# Patient Record
Sex: Female | Born: 1950 | Race: White | Hispanic: No | Marital: Single | Smoking: Former smoker
Health system: Southern US, Community
[De-identification: ages and names within clinical notes are randomized; demographics above are authoritative.]

## PROBLEM LIST (undated history)

## (undated) DIAGNOSIS — M069 Rheumatoid arthritis, unspecified: Secondary | ICD-10-CM

## (undated) DIAGNOSIS — E039 Hypothyroidism, unspecified: Secondary | ICD-10-CM

## (undated) DIAGNOSIS — Z860100 Personal history of colon polyps, unspecified: Secondary | ICD-10-CM

## (undated) DIAGNOSIS — I10 Essential (primary) hypertension: Secondary | ICD-10-CM

## (undated) DIAGNOSIS — K602 Anal fissure, unspecified: Secondary | ICD-10-CM

## (undated) DIAGNOSIS — E7800 Pure hypercholesterolemia, unspecified: Secondary | ICD-10-CM

## (undated) DIAGNOSIS — K649 Unspecified hemorrhoids: Secondary | ICD-10-CM

## (undated) HISTORY — DX: Essential (primary) hypertension: I10

## (undated) HISTORY — DX: Rheumatoid arthritis, unspecified: M06.9

## (undated) HISTORY — DX: Anal fissure, unspecified: K60.2

## (undated) HISTORY — DX: Pure hypercholesterolemia, unspecified: E78.00

## (undated) HISTORY — PX: TONSILLECTOMY: SUR1361

## (undated) HISTORY — DX: Personal history of colon polyps, unspecified: Z86.0100

## (undated) HISTORY — DX: Unspecified hemorrhoids: K64.9

## (undated) HISTORY — DX: Hypothyroidism, unspecified: E03.9

## (undated) HISTORY — DX: Personal history of colonic polyps: Z86.010

---

## 1997-06-19 HISTORY — PX: HEMORRHOID SURGERY: SHX153

## 1997-12-22 ENCOUNTER — Other Ambulatory Visit: Admission: RE | Admit: 1997-12-22 | Discharge: 1997-12-22 | Payer: Self-pay | Admitting: Obstetrics and Gynecology

## 1997-12-22 ENCOUNTER — Ambulatory Visit (HOSPITAL_COMMUNITY): Admission: RE | Admit: 1997-12-22 | Discharge: 1997-12-22 | Payer: Self-pay | Admitting: Obstetrics and Gynecology

## 1998-03-31 ENCOUNTER — Other Ambulatory Visit: Admission: RE | Admit: 1998-03-31 | Discharge: 1998-03-31 | Payer: Self-pay | Admitting: Obstetrics and Gynecology

## 1999-05-04 ENCOUNTER — Encounter: Payer: Self-pay | Admitting: *Deleted

## 1999-05-04 ENCOUNTER — Ambulatory Visit (HOSPITAL_COMMUNITY): Admission: RE | Admit: 1999-05-04 | Discharge: 1999-05-04 | Payer: Self-pay | Admitting: *Deleted

## 2010-05-05 ENCOUNTER — Encounter: Admission: RE | Admit: 2010-05-05 | Discharge: 2010-05-05 | Payer: Self-pay | Admitting: Obstetrics and Gynecology

## 2011-05-03 ENCOUNTER — Other Ambulatory Visit: Payer: Self-pay | Admitting: Obstetrics and Gynecology

## 2011-05-03 DIAGNOSIS — R928 Other abnormal and inconclusive findings on diagnostic imaging of breast: Secondary | ICD-10-CM

## 2011-05-18 ENCOUNTER — Other Ambulatory Visit: Payer: Self-pay | Admitting: Obstetrics and Gynecology

## 2011-05-18 ENCOUNTER — Ambulatory Visit
Admission: RE | Admit: 2011-05-18 | Discharge: 2011-05-18 | Disposition: A | Discharge status: Home / Self Care | Payer: No Typology Code available for payment source | Source: Ambulatory Visit | Attending: Obstetrics and Gynecology | Admitting: Obstetrics and Gynecology

## 2011-05-18 DIAGNOSIS — R928 Other abnormal and inconclusive findings on diagnostic imaging of breast: Secondary | ICD-10-CM

## 2011-06-20 HISTORY — PX: BREAST REDUCTION SURGERY: SHX8

## 2011-06-22 ENCOUNTER — Ambulatory Visit
Admission: RE | Admit: 2011-06-22 | Discharge: 2011-06-22 | Disposition: A | Discharge status: Home / Self Care | Payer: PRIVATE HEALTH INSURANCE | Source: Ambulatory Visit | Attending: Obstetrics and Gynecology | Admitting: Obstetrics and Gynecology

## 2011-06-22 DIAGNOSIS — R928 Other abnormal and inconclusive findings on diagnostic imaging of breast: Secondary | ICD-10-CM

## 2012-03-07 ENCOUNTER — Other Ambulatory Visit: Payer: Self-pay | Admitting: Plastic Surgery

## 2013-10-03 ENCOUNTER — Other Ambulatory Visit: Payer: Self-pay | Admitting: Obstetrics and Gynecology

## 2013-10-03 DIAGNOSIS — Z9889 Other specified postprocedural states: Secondary | ICD-10-CM

## 2013-10-03 DIAGNOSIS — N6310 Unspecified lump in the right breast, unspecified quadrant: Secondary | ICD-10-CM

## 2013-10-10 ENCOUNTER — Ambulatory Visit
Admission: RE | Admit: 2013-10-10 | Discharge: 2013-10-10 | Disposition: A | Discharge status: Home / Self Care | Payer: PRIVATE HEALTH INSURANCE | Source: Ambulatory Visit | Attending: Obstetrics and Gynecology | Admitting: Obstetrics and Gynecology

## 2013-10-10 ENCOUNTER — Encounter (INDEPENDENT_AMBULATORY_CARE_PROVIDER_SITE_OTHER): Payer: Self-pay

## 2013-10-10 DIAGNOSIS — N6310 Unspecified lump in the right breast, unspecified quadrant: Secondary | ICD-10-CM

## 2013-10-10 DIAGNOSIS — Z9889 Other specified postprocedural states: Secondary | ICD-10-CM

## 2014-05-08 HISTORY — PX: COLONOSCOPY: SHX174

## 2014-05-08 HISTORY — PX: ESOPHAGOGASTRODUODENOSCOPY: SHX1529

## 2014-11-01 IMAGING — MG MM DIGITAL DIAGNOSTIC BILAT
5 series · 5 of 5 positions shown · non-contrast
Comparison: Diagnostic left mammogram views dated 05/05/2010.

CLINICAL DATA: Palpable area of concern in the retroareolar right
breast in the 8 to 9 o'clock region. History of breast reduction in
3323. The patient denies any signs symptoms of chest infection.

EXAM:
DIGITAL DIAGNOSTIC  BILATERAL MAMMOGRAM WITH CAD
ULTRASOUND RIGHT BREAST

[R TAN]
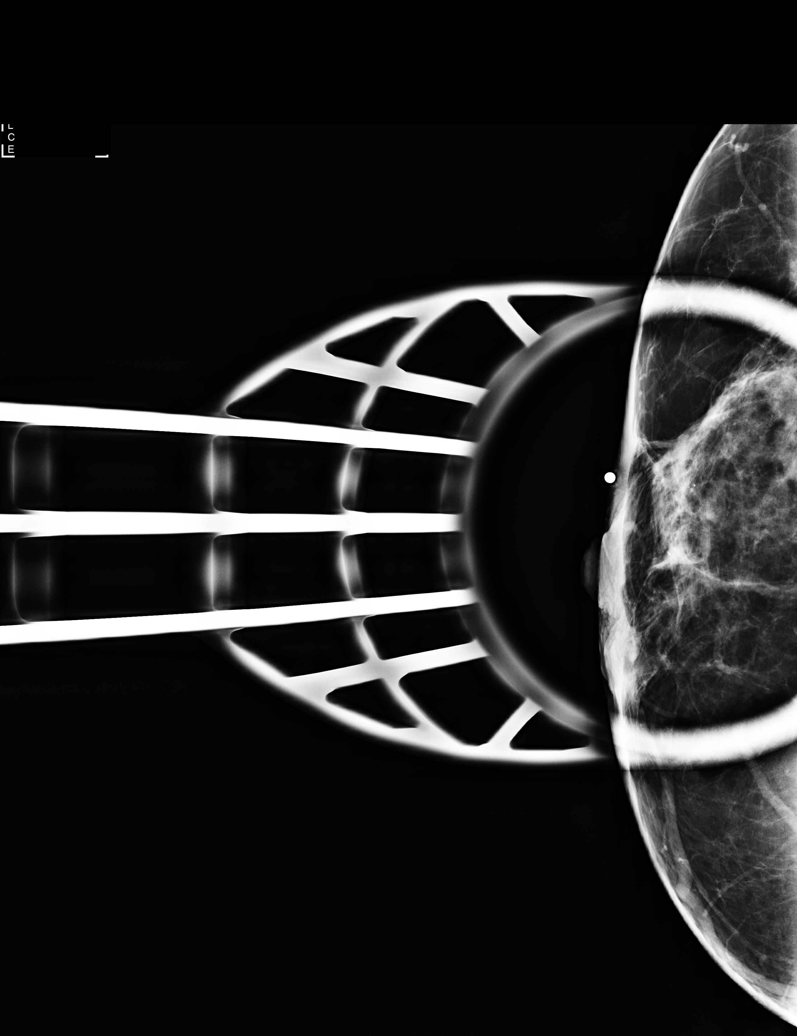

[R CC]
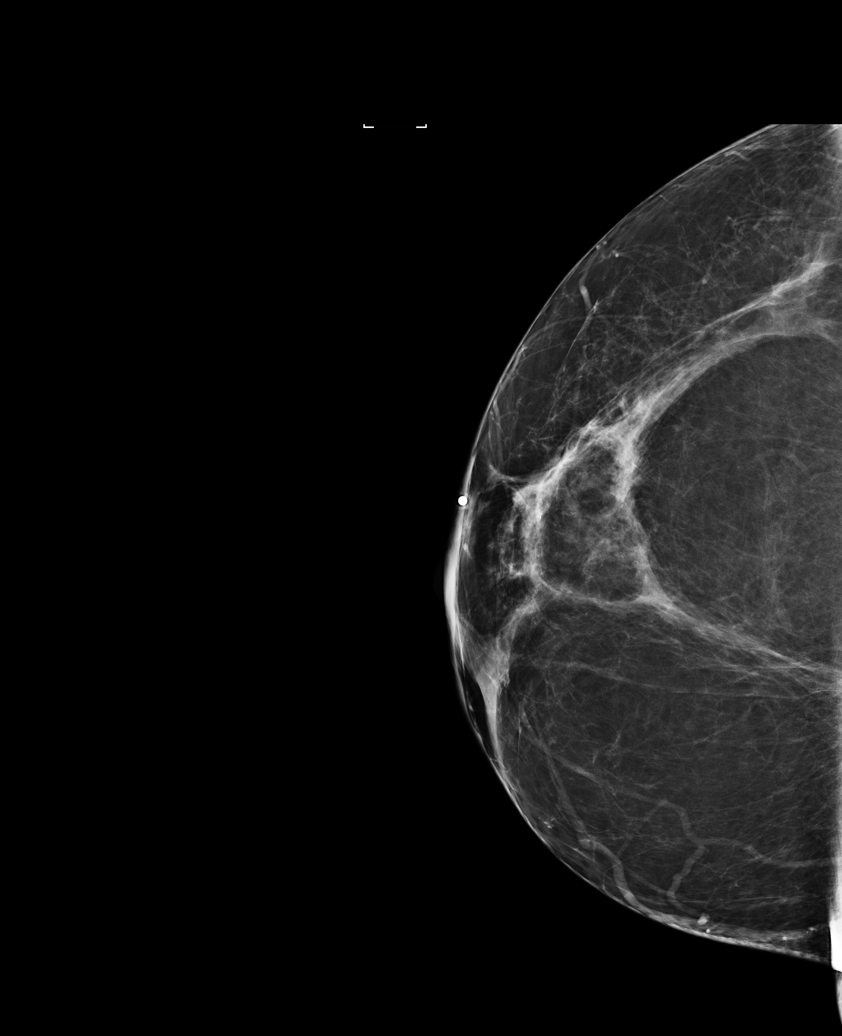

[L MLO]
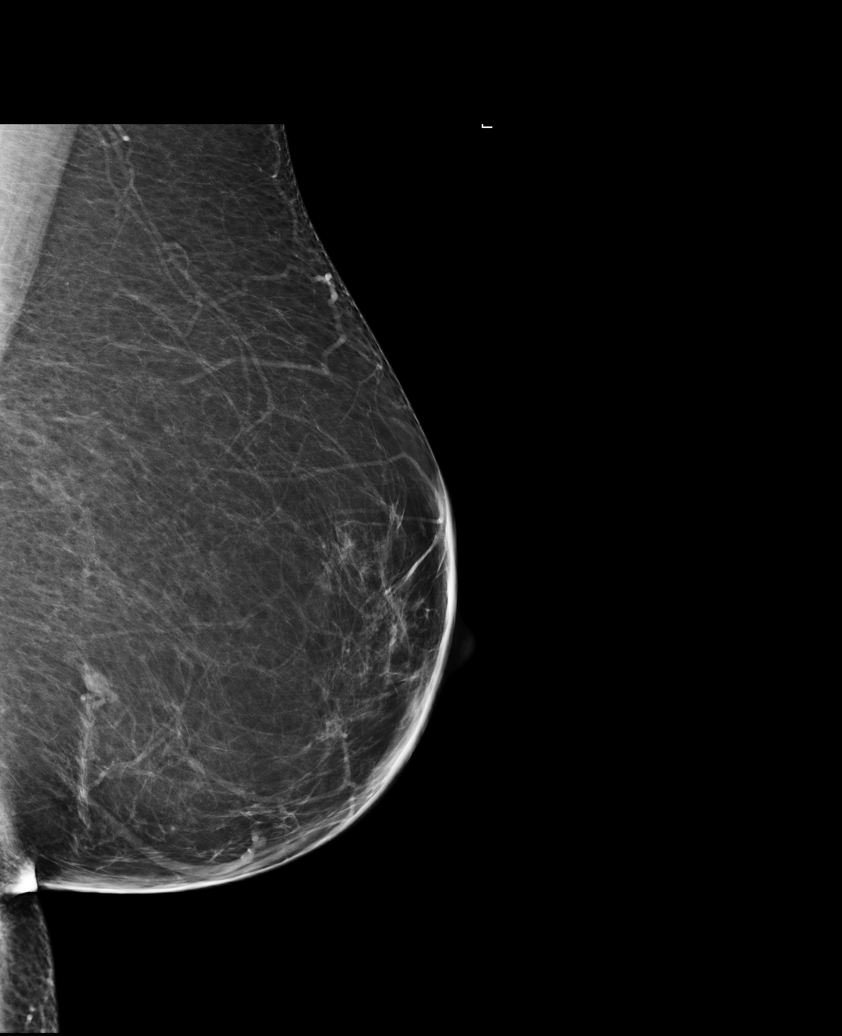

[R MLO]
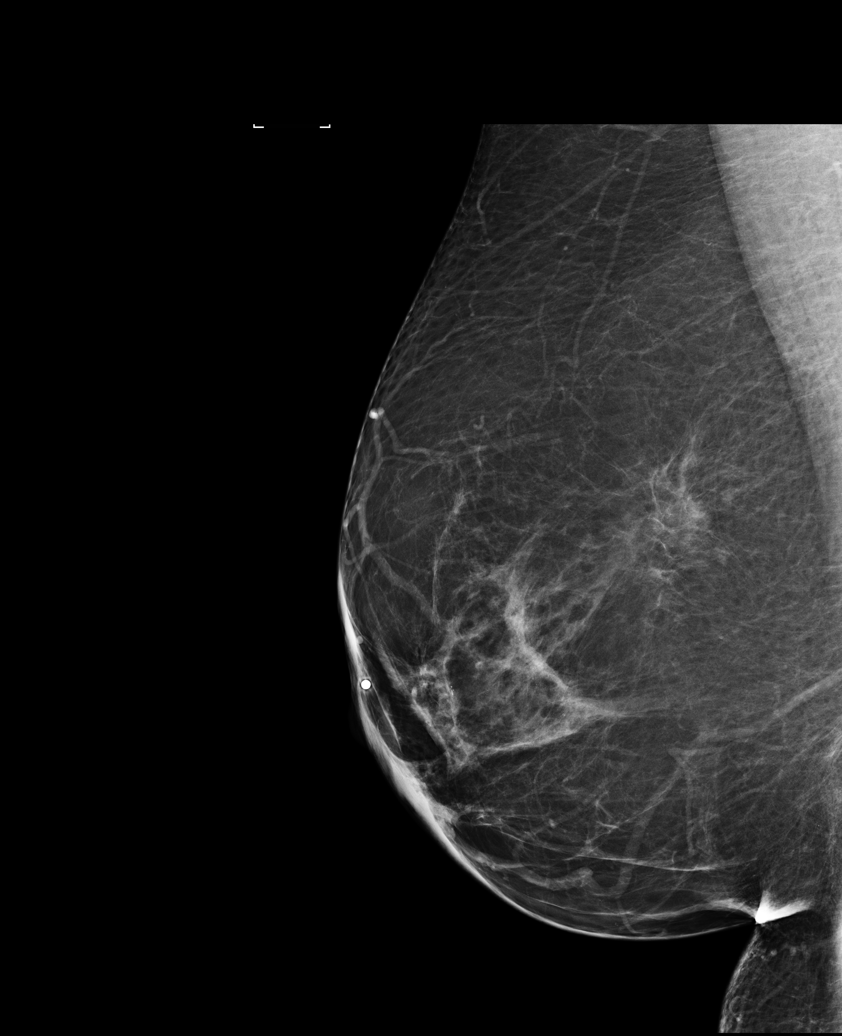

[L CC]
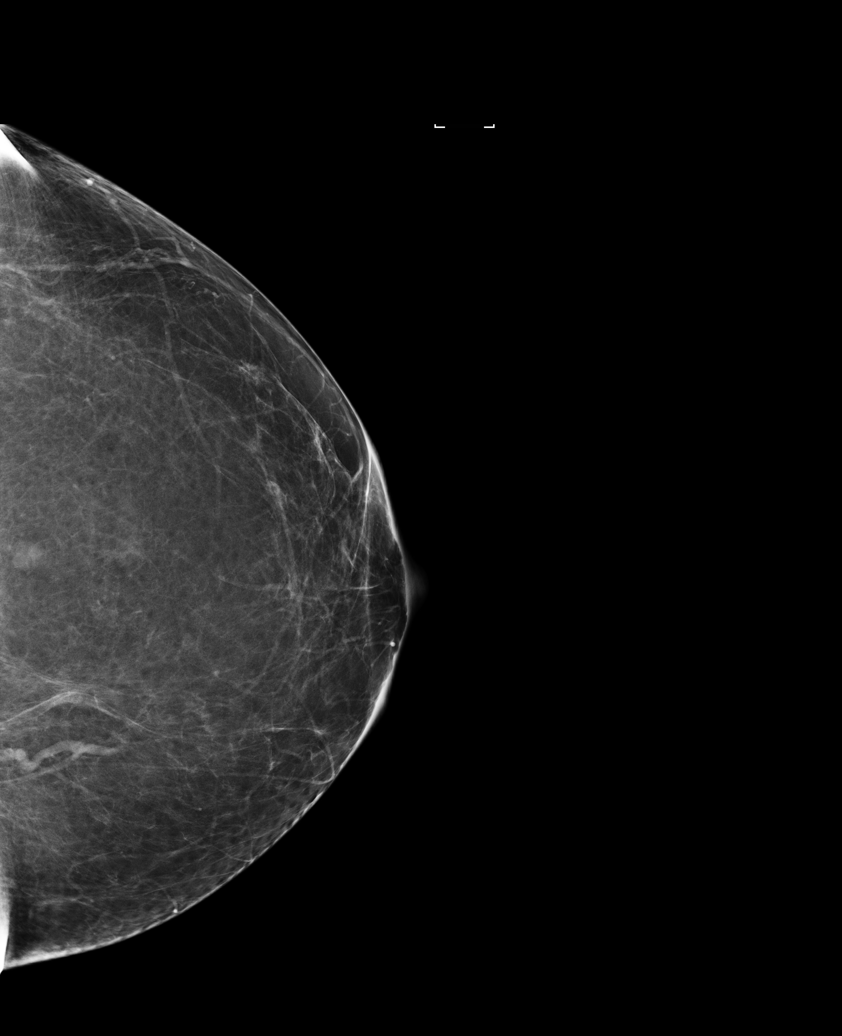

[5 of 5 positions shown; findings below may reference images not displayed]

ACR Breast Density Category b: There are scattered areas of
fibroglandular density.
FINDINGS: There are postsurgical changes of breast reduction bilaterally.
Postsurgical changes are more prominent in the right breast.
Curvilinear bandlike density surround a central area of fat density
in the retroareolar right breast at the region of palpable concern.
Mammographic findings are most consistent with postsurgical fat
necrosis. No suspicious, nonsurgical distortion, or suspicious
microcalcification is identified in either breast.

Mammographic images were processed with CAD.

On physical exam, there is a firm palpable area in the retroareolar
right breast centered at the 8 to 9 o'clock position.

Ultrasound is performed, showing a focal fluid collection at 9
o'clock position retroareolar that measures 3.0 x 1.6 x 1.9 cm. This
corresponds to a prominent area of fat density seen on the
mammogram, and is consistent with benign postsurgical fat necrosis,
in this patient status post bilateral breast reduction. No
suspicious findings on ultrasound.
IMPRESSION: Reduction mammoplasty changes bilaterally. No evidence of
malignancy. The palpable area of concern of the right breast is
consistent with benign fat necrosis.

RECOMMENDATION:
Screening mammogram in one year.(Code:VR-X-N8A)

I have discussed the findings and recommendations with the patient.
Results were also provided in writing at the conclusion of the
visit. If applicable, a reminder letter will be sent to the patient
regarding the next appointment.

BI-RADS CATEGORY  2: Benign.

## 2015-01-12 ENCOUNTER — Other Ambulatory Visit: Payer: Self-pay | Admitting: Obstetrics and Gynecology

## 2015-01-13 LAB — CYTOLOGY - PAP

## 2015-04-15 ENCOUNTER — Ambulatory Visit (INDEPENDENT_AMBULATORY_CARE_PROVIDER_SITE_OTHER): Payer: PRIVATE HEALTH INSURANCE | Admitting: Sports Medicine

## 2015-04-15 ENCOUNTER — Ambulatory Visit (INDEPENDENT_AMBULATORY_CARE_PROVIDER_SITE_OTHER): Payer: PRIVATE HEALTH INSURANCE

## 2015-04-15 DIAGNOSIS — M7752 Other enthesopathy of left foot: Secondary | ICD-10-CM | POA: Diagnosis not present

## 2015-04-15 DIAGNOSIS — I739 Peripheral vascular disease, unspecified: Secondary | ICD-10-CM | POA: Diagnosis not present

## 2015-04-15 DIAGNOSIS — M722 Plantar fascial fibromatosis: Secondary | ICD-10-CM

## 2015-04-15 DIAGNOSIS — M778 Other enthesopathies, not elsewhere classified: Secondary | ICD-10-CM

## 2015-04-15 DIAGNOSIS — M79673 Pain in unspecified foot: Secondary | ICD-10-CM | POA: Diagnosis not present

## 2015-04-15 DIAGNOSIS — M7742 Metatarsalgia, left foot: Secondary | ICD-10-CM | POA: Diagnosis not present

## 2015-04-15 DIAGNOSIS — M779 Enthesopathy, unspecified: Secondary | ICD-10-CM

## 2015-04-15 NOTE — Progress Notes (Deleted)
   Subjective:    Patient ID: Brandi Potter, female    DOB: 06-16-1951, 64 y.o.   MRN: [id number]  HPI    Review of Systems  Endocrine:       Thyroid  Musculoskeletal:       Right ankle swelling , " some "   All other systems reviewed and are negative.      Objective:   Physical Exam        Assessment & Plan:

## 2015-04-15 NOTE — Progress Notes (Signed)
Patient ID: Brandi Potter, female   DOB: Dec 04, 1950, 64 y.o.   MRN: [id number] Subjective: Brandi Potter is a 64 y.o. female patient presents to office with complaint of heel pain on the right heel. Patient admits to post static dyskinesia since the summer; reports that she bought new sandals that are hard on the back of her heels that started her symptoms. The area appeared to get better but still is bothering her and so she thought she should get it checked out. Patient has treated this problem with wear sneakers with some improvement. Patient reports that also her left 2nd toe hurts; has past injury to foot 10 years ago in [place] and never had it checked. Patient states that the 2nd toe hurts sometimes with walking and is made better by the shoes that she wears. Denies any other pedal complaints.  Review of Systems  Endocrine:       Thyroid  Musculoskeletal:       Right ankle swelling , " some "   All other systems reviewed and are negative.   There are no active problems to display for this patient.  No current outpatient prescriptions on file prior to visit.   No current facility-administered medications on file prior to visit.   Allergies  Allergen Reactions  . Aleve [Naproxen Sodium] Hives     Objective: Physical Exam General: The patient is alert and oriented x3 in no acute distress.  Dermatology: Skin is warm, dry and supple bilateral lower extremities. Nails 1-10 are normal. There is no erythema, edema, no eccymosis, no open lesions present. Integument is otherwise unremarkable.  Vascular: Dorsalis Pedis pulse and Posterior Tibial pulse are 1/4 bilateral. Capillary fill time is immediate to all digits. Mild trace edema Right>left ankles with varicosities and mild brawny hyperpigmentation.   Neurological: Grossly intact to light touch with an achilles reflex of +2/5 and a  negative Tinel's sign bilateral.  Musculoskeletal: Mild tenderness to palpation at the medial calcaneal  tubercale and through the insertion of the plantar fascia on the right foot. Mild tenderness to left 2nd MTPJ with clicking on range of motion. All other joints within normal limits. No pain with compression of calcaneus bilateral. No pain with tuning fork to calcaneus bilateral. No pain with calf compression bilateral. Strength 5/5 in all groups bilateral.   Gait: Unassisted, Non-Antalgic  Xray, Right & Left foot: 3 Views Decreased osseous mineralization. Joint spaces narrowing at 1st and 2nd mtpj with medial dislocation of the 2nd on the left. There is bunion deformity present left>right with arthritic changes. No fracture/dislocation/boney destruction. Calcaneal spur present with mild thickening of plantar fascia. No other soft tissue abnormalities or radiopaque foreign bodies.   Assessment and Plan: Problem List Items Addressed This Visit    None    Visit Diagnoses    Foot pain, unspecified laterality    -  Primary    Relevant Orders    DG Foot Complete Left    DG Foot Complete Right    Plantar fasciitis of right foot        Metatarsalgia of left foot        Capsulitis of foot, left        2nd MTPJ    PVD (peripheral vascular disease) (HCC)        Relevant Medications    atorvastatin (LIPITOR) 10 MG tablet        -Complete examination performed. Discussed with patient in detail the condition of plantar fasciitis, how this  occurs and general treatment options. Explained both conservative and surgical treatments. Also discussed in detail Left 2nd toe deviation and arthritic changes and treatment options.  -Patient refused steroid injection and anti-inflammatories at today's visit; states that she's not in that much pain to need an injection right now and wants to continue with OTC motrin as needed. -Recommended good supportive shoes. -Dispensed metatarsal pad for left; if works well can consider orthotics with met pad incorporated. - Explained in detail the use of the fascial brace  and heel cups which was dispensed at today's visit for right. -Explained and dispensed to patient daily stretching exercises. -Recommend patient to ice affected area 1-2x daily. -Patient to return to office as needed sooner if problems or questions  Arise.  Landis Martins, DPM

## 2015-04-15 NOTE — Patient Instructions (Signed)

## 2019-08-14 HISTORY — PX: HERNIA REPAIR: SHX51

## 2019-12-18 ENCOUNTER — Encounter: Payer: Self-pay | Admitting: Gastroenterology

## 2020-02-17 ENCOUNTER — Encounter: Payer: Self-pay | Admitting: Gastroenterology

## 2020-02-17 ENCOUNTER — Ambulatory Visit: Payer: PRIVATE HEALTH INSURANCE | Admitting: Gastroenterology

## 2020-02-17 VITALS — BP 124/70 | HR 62 | Ht 68.0 in | Wt 215.2 lb

## 2020-02-17 DIAGNOSIS — R152 Fecal urgency: Secondary | ICD-10-CM | POA: Diagnosis not present

## 2020-02-17 DIAGNOSIS — R159 Full incontinence of feces: Secondary | ICD-10-CM | POA: Diagnosis not present

## 2020-02-17 DIAGNOSIS — K581 Irritable bowel syndrome with constipation: Secondary | ICD-10-CM | POA: Diagnosis not present

## 2020-02-17 MED ORDER — CHOLESTYRAMINE 4 G PO PACK: 4.0000 g | PACK | Freq: Every day | ORAL | 3 refills | Status: DC

## 2020-02-17 NOTE — Patient Instructions (Signed)
If you are age 69 or older, your body mass index should be between 23-30. Your Body mass index is 32.73 kg/m. If this is out of the aforementioned range listed, please consider follow up with your Primary Care Provider.  If you are age 32 or younger, your body mass index should be between 19-25. Your Body mass index is 32.73 kg/m. If this is out of the aformentioned range listed, please consider follow up with your Primary Care Provider.   Cut down on fiber.   We have sent the following medications to your pharmacy for you to pick up at your convenience:  Cholestyramine   Please purchase the following medications over the counter and take as directed: Oscal 500 mg once daily.   Stop citracal   Follow up in 8 weeks.   Thank you,  Dr. Lynann Bologna

## 2020-02-17 NOTE — Progress Notes (Signed)
Chief Complaint:   Referring Provider:  Pc, [facility]*      ASSESSMENT AND PLAN;   #1. Rectal incontinence. Neg colon 04/2014 as below.  #2. IBS-D  Plan: -Cut down on fiber. -Trial of Cholestryamine 4g po qd. 2 hours before or after rest of the medications. -Start Oscal 500mg  po qd -Stop Citracal for now.  If need be, would add Benefiber. -FU in 8 weeks.  At FU, Kegel's exercises/Bentyl, would consider rpt colon/anorectal manometry with biofeedback if she continues to have problems.   HPI:    Brandi Potter is a 69 y.o. female  With HTN, HLD, ?Glucose intolerance (per records sent to 73, no mention of diabetes), CKD with stool incontinence, urgency since January 2021 ever since she retired from February 2021.  She denies having any problems with urine incontinence.  Has urgency to have bowel movements, right after eating.  No blood in the stool.  Describes stool as mushy, 2-3/day, after meals, especially after fatty meals.  This has "taken over my life".  She is afraid to travel.  She took Imodium to come here.  Seen by Dr. [employer].  Has been given a trial of Citracal, which improved her symptoms minimally.  No nausea, vomiting, heartburn, regurgitation, odynophagia or dysphagia.  No significant diarrhea or constipation. No melena or hematochezia. No unintentional weight loss. No abdominal or rectal pain.  No bloating.  She was started on Celebrex in June 2021.  Her symptoms predate Celebrex.  No sodas, chocolates, chewing gums, artificial sweeteners and candy. No other OTC NSAIDs  Recently had repair of periumbilical hernia.   Past GI procedures: -Colonoscopy (Dr July 2021) 05/08/2014: Mild left colonic diverticulosis, external hemorrhoids.  Repeat in 10 years.  Colonoscopy 02/2004: Diminutive colonic polyps s/p polypectomy, small internal and external hemorrhoids. Bx-hyperplastic. -EGD 05/08/2014 (Dr 05/10/2014): neg. S/P dilatation 54 Fr -CT pelvis without contrast (not CT  abdomen) 08/08/2019 periumbilical hernia, degenerative joint disease. Past Medical History:  Diagnosis Date  . Anal fissure   . High cholesterol   . History of colon polyps   . Hypertension   . Hypothyroidism   . RA (rheumatoid arthritis) (HCC)     Past Surgical History:  Procedure Laterality Date  . COLONOSCOPY     Dr 08/10/2019  . HEMORRHOID SURGERY  1999   Dr 2000  . HERNIA REPAIR  08/14/2019   periumbillical hernia  . TONSILLECTOMY      Family History  Problem Relation Age of Onset  . Cancer Father        nasopharyngeal  . Colon cancer Neg Hx     Social History   Tobacco Use  . Smoking status: Former 08/16/2019  . Smokeless tobacco: Never Used  Vaping Use  . Vaping Use: Never used  Substance Use Topics  . Alcohol use: Yes    Comment: ocassionally  . Drug use: Not Currently    Current Outpatient Medications  Medication Sig Dispense Refill  . Calcium Citrate (CITRACAL PO) Take by mouth.    . celecoxib (CELEBREX) 200 MG capsule Take 200 mg by mouth daily.    Games developer levothyroxine (SYNTHROID) 88 MCG tablet Take 88 mcg by mouth daily before breakfast.    . lisinopril (ZESTRIL) 10 MG tablet Take 5 mg by mouth daily.    . progesterone (PROMETRIUM) 200 MG capsule Take 200 mg by mouth daily.    . sertraline (ZOLOFT) 25 MG tablet Take 12.5 mg by mouth daily.      No current  facility-administered medications for this visit.    Allergies  Allergen Reactions  . Aleve [Naproxen Sodium] Hives    Review of Systems:  Constitutional: Denies fever, chills, diaphoresis, appetite change and fatigue.  HEENT: Denies photophobia, eye pain, redness, hearing loss, ear pain, congestion, sore throat, rhinorrhea, sneezing, mouth sores, neck pain, neck stiffness and tinnitus.   Respiratory: Denies SOB, DOE, cough, chest tightness,  and wheezing.   Cardiovascular: Denies chest pain, palpitations and leg swelling.  Genitourinary: Denies dysuria, urgency, frequency, hematuria, flank pain  and difficulty urinating.  Musculoskeletal: Denies myalgias, has back pain, joint swelling, arthralgias and gait problem.  Skin: No rash.  Neurological: Denies dizziness, seizures, syncope, weakness, light-headedness, numbness and headaches.  Hematological: Denies adenopathy. Easy bruising, personal or family bleeding history  Psychiatric/Behavioral: Has anxiety or depression     Physical Exam:    BP 124/70   Pulse 62   Ht 5\' 8"  (1.727 m)   Wt 215 lb 4 oz (97.6 kg)   BMI 32.73 kg/m  Wt Readings from Last 3 Encounters:  02/17/20 215 lb 4 oz (97.6 kg)   Constitutional:  Well-developed, in no acute distress. Psychiatric: Normal mood and affect. Behavior is normal. HEENT: Pupils normal.  Conjunctivae are normal. No scleral icterus. Neck supple.  Cardiovascular: Normal rate, regular rhythm. No edema Pulmonary/chest: Effort normal and breath sounds normal. No wheezing, rales or rhonchi. Abdominal: Soft, nondistended. Nontender.  Well-healed surgical scars.  Bowel sounds active throughout. There are no masses palpable. No hepatomegaly. Rectal: Seen in presence of Brook CMA.  Small external hemorrhoids, good rectal tone.  Stool brown heme-negative. Neurological: Alert and oriented to person place and time. Skin: Skin is warm and dry. No rashes noted. Extensive notes were reviewed   02/19/20, MD 02/17/2020, 11:26 AM  Cc: P[facility], [facility]*

## 2020-03-12 ENCOUNTER — Other Ambulatory Visit: Payer: Self-pay | Admitting: Internal Medicine

## 2020-03-12 DIAGNOSIS — N1831 Chronic kidney disease, stage 3a: Secondary | ICD-10-CM

## 2020-03-15 ENCOUNTER — Telehealth: Payer: Self-pay | Admitting: Gastroenterology

## 2020-03-15 NOTE — Telephone Encounter (Signed)
Pt is requesting a call back from a nurse to discuss Questran medication. Pt states this medication is too expensive for her and if there is any alternative cheaper medications.

## 2020-03-16 NOTE — Telephone Encounter (Signed)
Please advise 

## 2020-03-18 ENCOUNTER — Ambulatory Visit
Admission: RE | Admit: 2020-03-18 | Discharge: 2020-03-18 | Disposition: A | Discharge status: Home / Self Care | Payer: Medicare Other | Source: Ambulatory Visit | Attending: Internal Medicine | Admitting: Internal Medicine

## 2020-03-18 DIAGNOSIS — N1831 Chronic kidney disease, stage 3a: Secondary | ICD-10-CM

## 2020-03-22 MED ORDER — CHOLESTYRAMINE 4 G PO PACK: 4.0000 g | PACK | Freq: Every day | ORAL | 0 refills | Status: DC

## 2020-03-22 NOTE — Telephone Encounter (Signed)
Trish, Please make sure that she is taking generic cholestyramine (not cholestyramine lite) The other medicines may be more expensive Can try cutting it down to half a pack a day (and see if it works as good-will cut cost to half [employer]

## 2020-03-22 NOTE — Telephone Encounter (Signed)
I have called and spoke to patient, patient is agreeable with the plan. I have mailed patient a GoodRx card to try to reduce the price. Patient was made an appointment per her request.

## 2020-05-17 ENCOUNTER — Encounter: Payer: Self-pay | Admitting: Gastroenterology

## 2020-05-17 ENCOUNTER — Ambulatory Visit: Payer: Medicare Other | Admitting: Gastroenterology

## 2020-05-17 VITALS — BP 120/64 | HR 72 | Ht 67.5 in | Wt 216.5 lb

## 2020-05-17 DIAGNOSIS — K581 Irritable bowel syndrome with constipation: Secondary | ICD-10-CM | POA: Diagnosis not present

## 2020-05-17 DIAGNOSIS — R159 Full incontinence of feces: Secondary | ICD-10-CM | POA: Diagnosis not present

## 2020-05-17 DIAGNOSIS — R152 Fecal urgency: Secondary | ICD-10-CM

## 2020-05-17 MED ORDER — CHOLESTYRAMINE 4 G PO PACK: 4.0000 g | PACK | Freq: Every day | ORAL | 6 refills | Status: DC

## 2020-05-17 NOTE — Progress Notes (Signed)
Chief Complaint:   Referring Provider:  Pc, [facility]*      ASSESSMENT AND PLAN;   #1. Rectal incontinence (resolved). Neg colon 04/2014 as below.  #2. IBS-D  #3. LLQ pain-likely musculoskeletal. R/O other causes. Neg CT pelvis 07/2019  Plan: - Continue FU with Dr Melanee Spry at [facility]. - If LLQ pain gets worse, then CT AP with PO/IV.  At the present time she would like to hold off. - Continue Cholestryamine 4g po qd. 2 hours before or after rest of the medications #30, 6 refills - Continue Oscal 500mg  po qd   HPI:    Brandi Potter is a 69 y.o. female  With HTN, HLD, ?Glucose intolerance (per records sent to 78, no mention of diabetes), CKD with stool incontinence, urgency since January 2021 ever since she retired from February 2021.  She denies having any problems with urine incontinence.  Much better now ever since she has been on Questran.  Would occasionally get constipated.  C/O LLQ pain x few months. Not worse after defecation.  No fever or chills.  No nausea, vomiting, heartburn, regurgitation, odynophagia or dysphagia.  No significant diarrhea or constipation. No melena or hematochezia. No unintentional weight loss. No abdominal or rectal pain.  No bloating.  She was started on Celebrex in June 2021.  Her symptoms predate Celebrex.  No sodas, chocolates, chewing gums, artificial sweeteners and candy. No other OTC NSAIDs  Recently had repair of periumbilical hernia.   Past GI procedures: -Colonoscopy (Dr July 2021) 05/08/2014: Mild left colonic diverticulosis, external hemorrhoids.  Repeat in 10 years.  Colonoscopy 02/2004: Diminutive colonic polyps s/p polypectomy, small internal and external hemorrhoids. Bx-hyperplastic. -EGD 05/08/2014 (Dr 05/10/2014): neg. S/P dilatation 54 Fr -CT pelvis without contrast (not CT abdomen) 08/08/2019 periumbilical hernia, degenerative joint disease. Past Medical History:  Diagnosis Date  . Anal fissure   . Hemorrhoids   . High  cholesterol   . History of colon polyps   . Hypertension   . Hypothyroidism   . RA (rheumatoid arthritis) (HCC)     Past Surgical History:  Procedure Laterality Date  . COLONOSCOPY  05/08/2014   Dr 05/10/2014. Mild diverticular disease left colon. External hemorrhoids.  . ESOPHAGOGASTRODUODENOSCOPY  05/08/2014   Dr 05/10/2014. Normal upper endoscopy. Empiric esophageal dilation to [address]  . HEMORRHOID SURGERY  1999   Dr 2000  . HERNIA REPAIR  08/14/2019   periumbillical hernia  . TONSILLECTOMY      Family History  Problem Relation Age of Onset  . Cancer Father        nasopharyngeal  . Colon cancer Neg Hx     Social History   Tobacco Use  . Smoking status: Former 08/16/2019  . Smokeless tobacco: Never Used  Vaping Use  . Vaping Use: Never used  Substance Use Topics  . Alcohol use: Yes    Comment: ocassionally  . Drug use: Not Currently    Current Outpatient Medications  Medication Sig Dispense Refill  . Calcium Citrate (CITRACAL PO) Take by mouth.    . cholestyramine (QUESTRAN) 4 g packet Take 1 packet (4 g total) by mouth daily. 2 hours before or after a ll other medications. May take in applesauce. 30 packet 0  . levothyroxine (SYNTHROID) 88 MCG tablet Take 88 mcg by mouth daily before breakfast.    . lisinopril (ZESTRIL) 10 MG tablet Take 5 mg by mouth daily.    . progesterone (PROMETRIUM) 200 MG capsule Take 200 mg by mouth  daily.    . sertraline (ZOLOFT) 25 MG tablet Take 12.5 mg by mouth daily.      No current facility-administered medications for this visit.    Allergies  Allergen Reactions  . Aleve [Naproxen Sodium] Hives    Review of Systems:  Constitutional: Denies fever, chills, diaphoresis, appetite change and fatigue.  HEENT: Denies photophobia, eye pain, redness, hearing loss, ear pain, congestion, sore throat, rhinorrhea, sneezing, mouth sores, neck pain, neck stiffness and tinnitus.   Respiratory: Denies SOB, DOE, cough, chest tightness,   and wheezing.   Cardiovascular: Denies chest pain, palpitations and leg swelling.  Genitourinary: Denies dysuria, urgency, frequency, hematuria, flank pain and difficulty urinating.  Musculoskeletal: Denies myalgias, has back pain, joint swelling, arthralgias and gait problem.  Skin: No rash.  Neurological: Denies dizziness, seizures, syncope, weakness, light-headedness, numbness and headaches.  Hematological: Denies adenopathy. Easy bruising, personal or family bleeding history  Psychiatric/Behavioral: Has anxiety or depression     Physical Exam:    BP 120/64   Pulse 72   Ht 5' 7.5" (1.715 m)   Wt 216 lb 8 oz (98.2 kg)   BMI 33.41 kg/m  Wt Readings from Last 3 Encounters:  05/17/20 216 lb 8 oz (98.2 kg)  02/17/20 215 lb 4 oz (97.6 kg)   Constitutional:  Well-developed, in no acute distress. Psychiatric: Normal mood and affect. Behavior is normal. HEENT: Pupils normal.  Conjunctivae are normal. No scleral icterus. Neck supple.  Cardiovascular: Normal rate, regular rhythm. No edema Pulmonary/chest: Effort normal and breath sounds normal. No wheezing, rales or rhonchi. Abdominal: Soft, nondistended. Nontender.  Well-healed surgical scars.  Bowel sounds active throughout. There are no masses palpable. No hepatomegaly. Rectal: Seen in presence of Brook CMA.  Small external hemorrhoids, good rectal tone.  Stool brown heme-negative. Neurological: Alert and oriented to person place and time. Skin: Skin is warm and dry. No rashes noted. Extensive notes were reviewed   [name], MD 05/17/2020, 11:09 AM  Cc: P[facility], [facility]*

## 2020-05-17 NOTE — Patient Instructions (Signed)
If you are age 69 or older, your body mass index should be between 23-30. Your Body mass index is 33.41 kg/m. If this is out of the aforementioned range listed, please consider follow up with your Primary Care Provider.  If you are age 57 or younger, your body mass index should be between 19-25. Your Body mass index is 33.41 kg/m. If this is out of the aformentioned range listed, please consider follow up with your Primary Care Provider.    We have sent the following medications to your pharmacy for you to pick up at your convenience: Cholestyramine   Thank you,  Dr. Lynann Bologna

## 2020-06-19 DIAGNOSIS — N1831 Chronic kidney disease, stage 3a: Secondary | ICD-10-CM

## 2020-06-19 HISTORY — DX: Chronic kidney disease, stage 3a: N18.31

## 2020-10-12 ENCOUNTER — Institutional Professional Consult (permissible substitution): Payer: PRIVATE HEALTH INSURANCE | Admitting: Neurology

## 2020-10-14 ENCOUNTER — Institutional Professional Consult (permissible substitution): Payer: PRIVATE HEALTH INSURANCE | Admitting: Neurology

## 2020-10-14 ENCOUNTER — Encounter: Payer: Self-pay | Admitting: Neurology

## 2020-10-14 ENCOUNTER — Ambulatory Visit: Payer: Medicare Other | Admitting: Neurology

## 2020-10-14 VITALS — BP 149/84 | HR 57 | Ht 67.5 in | Wt 216.0 lb

## 2020-10-14 DIAGNOSIS — R0683 Snoring: Secondary | ICD-10-CM | POA: Diagnosis not present

## 2020-10-14 DIAGNOSIS — G4719 Other hypersomnia: Secondary | ICD-10-CM

## 2020-10-14 NOTE — Patient Instructions (Signed)

## 2020-10-14 NOTE — Progress Notes (Signed)
SLEEP MEDICINE CLINIC    Provider:  Melvyn Novas, MD  Primary Care Physician:  Creig Hines [facility] [address] [place] [address]     Referring Provider: Creig Hines [facility] [address] [place],  [place] [address]  [place], NP          Chief Complaint according to patient   Patient presents with:    . New Patient (Initial Visit)           HISTORY OF PRESENT ILLNESS:   Brandi Potter is a 70 - year- old Caucasian female patient seen here as a referral on 10/14/2020 from [place], NP   Chief concern according to patient :  Presents today to discuss sleepiness concerns. She is recently retired and she feels most of these concerns are r/t boredom or being able to do what she wants. She has never had a SS, has been told she snores - her neighbor stated she can hear her snoring-  Avg 7-9 hours of sleep, wakes up refreshed.  She can do what she needs to do - but she feels a little without structure-  when she gets home take 3-4 hour nap. Decrease in energy level, hearing loss, leg pain, hernia , and close family stressors.     Brandi Potter  has a past medical history of Anal fissure, Hemorrhoids, High cholesterol, Hernia, leg pain- History of colon polyps, Hypertension, CKD 3 ( no more celebrex) ,  Hypothyroidism ( on synthroid) , and RA (rheumatoid arthritis) (HCC). Less hypertension after retirement.     Sleep relevant medical history: Snoring, joint pain, Tonsillectomy- adenoid, hearing loss.  nocturia 1-2 .  Family medical /sleep history: No other family member on CPAP with OSA, insomnia, sleep walkers.    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  The patient was an Film/video editor- Pets are not present. Tobacco use- 30 years ago.  ETOH use; socially- seldomly,  Caffeine intake in form of Coffee(  /) Soda( *4 a day ) Tea ( or / sweet) or energy drinks. Regular exercise in form of walking.     Sleep habits are as follows: The patient's dinner time is between 5-6 PM. The patient goes to bed at 11.30 PM and continues to sleep for 4-7 hours, wakes for 1-2 bathroom breaks, the first time at 2 AM.   Watches TV in the den- none in Bed. Bedroom is cool, quiet and dark.  The preferred sleep position is sideways , with the support of 2-3 pillows. Bodypillow, knee pillow, 1  Dreams are reportedly frequent- recently some night mares. Marland Kitchen  7 AM is the usual rise time. The patient wakes up with an alarm.  She reports often feeling refreshed - restored in AM, with symptoms such as dry mouth 9 if she was sleeping on her back  ,  She was a mouth breather before tonsillectomy- no morning headaches , and residual fatigue.  Naps are taken frequently, lasting hours and are interfering  nocturnal sleep.    Review of Systems: Out of a complete 14 system review, the patient complains of only the following symptoms, and all other reviewed systems are negative.:  Fatigue,snoring, fragmented sleep, Insomnia- sleep phase shifting.    How likely are you to doze in the following situations: 0 =  not likely, 1 = slight chance, 2 = moderate chance, 3 = high chance   Sitting and Reading? Watching Television? Sitting inactive in a public place (theater or meeting)? As a passenger in a car for an hour without a break? Lying down in the afternoon when circumstances permit? Sitting and talking to someone? Sitting quietly after lunch without alcohol? In a car, while stopped for a few minutes in traffic?   Total = 8/ 24 points  With daily naps....  FSS endorsed at 22/ 63 points.   Social History   Socioeconomic History  . Marital status: Married    Spouse name: Not on file  . Number of children: Not on file  . Years of education: Not on file  . Highest education level: Not on file  Occupational History  . Not on  file  Tobacco Use  . Smoking status: Former Games developermoker  . Smokeless tobacco: Never Used  Vaping Use  . Vaping Use: Never used  Substance and Sexual Activity  . Alcohol use: Yes    Comment: ocassionally  . Drug use: Not Currently  . Sexual activity: Not on file  Other Topics Concern  . Not on file  Social History Narrative  . Not on file   Social Determinants of Health   Financial Resource Strain: Not on file  Food Insecurity: Not on file  Transportation Needs: Not on file  Physical Activity: Not on file  Stress: Not on file  Social Connections: Not on file    Family History  Problem Relation Age of Onset  . Cancer Father        nasopharyngeal  . Colon cancer Neg Hx     Past Medical History:  Diagnosis Date  . Anal fissure   . Hemorrhoids   . High cholesterol   . History of colon polyps   . Hypertension   . Hypothyroidism   . RA (rheumatoid arthritis) (HCC)     Past Surgical History:  Procedure Laterality Date  . COLONOSCOPY  05/08/2014   Dr Jennye Boroughsmisenheimer. Mild diverticular disease left colon. External hemorrhoids.  . ESOPHAGOGASTRODUODENOSCOPY  05/08/2014   Dr Jennye Boroughsmisenheimer. Normal upper endoscopy. Empiric esophageal dilation to [address] [place]  . HEMORRHOID SURGERY  1999   Dr Caryl AspMinsese  . HERNIA REPAIR  08/14/2019   periumbillical hernia  . TONSILLECTOMY       Current Outpatient Medications on File Prior to Visit  Medication Sig Dispense Refill  . Calcium Citrate (CITRACAL PO) Take by mouth.    . cholestyramine (QUESTRAN) 4 g packet Take 1 packet (4 g total) by mouth daily. 2 hours before or after a ll other medications. May take in applesauce. 30 packet 6  . levothyroxine (SYNTHROID) 88 MCG tablet Take 88 mcg by mouth daily before breakfast.    . progesterone (PROMETRIUM) 200 MG capsule Take 200 mg by mouth daily.    . sertraline (ZOLOFT) 25 MG tablet Take 12.5 mg by mouth daily.      No current facility-administered medications on file prior to visit.     Allergies  Allergen Reactions  . Aleve [Naproxen Sodium] Hives    Physical exam:  Today's Vitals   10/14/20 1132  BP: (!) 149/84  Pulse: (!) 57  Weight: 216 lb (98 kg)  Height: 5' 7.5" (1.715 m)   Body mass index is 33.33 kg/m.   Wt Readings from Last 3 Encounters:  10/14/20 216 lb (98 kg)  05/17/20 216 lb 8 oz (98.2 kg)  02/17/20 215 lb  4 oz (97.6 kg)     Ht Readings from Last 3 Encounters:  10/14/20 5' 7.5" (1.715 m)  05/17/20 5' 7.5" (1.715 m)  02/17/20 5\' 8"  (1.727 m)      General: The patient is awake, alert and appears not in acute distress. The patient is well groomed. Head: Normocephalic, atraumatic. Neck is supple. Mallampati 2,  neck circumference:14 inches . Nasal airflow  patent.  Retrognathia is not seen- small jaws and small mouth. .  Dental status: biological  Cardiovascular:  Regular rate and cardiac rhythm by pulse,  without distended neck veins. Respiratory: Lungs are clear to auscultation.  Skin:  Without evidence of ankle edema, or rash. Trunk: The patient's posture is erect.   Neurologic exam : The patient is awake and alert, oriented to place and time.   Memory subjective described as intact.  Attention span & concentration ability appears normal.  Speech is fluent,  without  dysarthria, dysphonia or aphasia.  Mood and affect are appropriate.   Cranial nerves: no loss of smell or taste reported  Pupils are equal and briskly reactive to light. Funduscopic exam deferred. .  Extraocular movements in vertical and horizontal planes were intact and without nystagmus. No Diplopia. Visual fields by finger perimetry are intact. Hearing was intact to soft voice and finger rubbing.   Facial sensation intact to fine touch. Facial motor strength is symmetric and tongue and uvula move midline.  Neck ROM : rotation, tilt and flexion extension were normal for age and shoulder shrug was symmetrical.    Motor exam:  Symmetric bulk, tone and somewhat  restricted neck ROM.    Normal tone without cog- wheeling, reduced grip strength . Left hand thenar eminence is atrophied, weaker grip than right.   Sensory:  Fine touch, pinprick and vibration were tested  and  normal.  Proprioception tested in the upper extremities was normal.   Coordination: Rapid alternating movements in the fingers/hands were of normal speed.  The Finger-to-nose maneuver was intact without evidence of ataxia, dysmetria or tremor.   Gait and station: Patient could rise unassisted from a seated position, walked without assistive device.  Unsteady when climbing stairs- and ascending too.  Stance is of normal width/ base and the patient turned with 3 steps.  Toe and heel walk were deferred.  Deep tendon reflexes: in the  upper and lower extremities are brisk  symmetric and intact.  Babinski response was deferred .       After spending a total time of  45  minutes face to face and for physical and neurologic examination, review of laboratory studies,  personal review of imaging studies, reports and results of other testing and review of referral information / records as far as provided in visit, I have established the following assessments:  1)  There have been a lot of changes since she entered retirement, and she is free floating- lost her structure for he days, sleeps in and goes to bed late, naps for long hours.  2) EDS- epworth is 10 - she is snoring and napping every day- EPOWORTH in reality is 14 or higher.  3) reduce caffeine intake 4) autoimmune disorder- rheumatoid arthritis. A fatiguing condition.  5) CKD .     My Plan is to proceed with:  1) set a morning rise time- avoid naps over 30 minutes- reduce caffeine 2) encourage to look for volunteer job. HST / PSG  To screen for sleep apnea.   I would like to thank  Pc, [facility], [facility] [address] [place],  [place] [address] for allowing me to meet with and to take care  of this pleasant patient.   In short, Brandi Potter is presenting with EDS, free sleep cycle.    Electronically signed by: Melvyn Novas, MD 10/14/2020 12:02 PM  [facility] Board certified by The [employer] of Sleep Medicine and Diplomate of the [employer] of Sleep Medicine. Board certified In Neurology through the ABPN, Fellow of the [employer] of Neurology. Medical Director of [facility].

## 2020-11-01 ENCOUNTER — Other Ambulatory Visit: Payer: Self-pay | Admitting: Gastroenterology

## 2020-11-01 DIAGNOSIS — R159 Full incontinence of feces: Secondary | ICD-10-CM

## 2020-11-01 DIAGNOSIS — K581 Irritable bowel syndrome with constipation: Secondary | ICD-10-CM

## 2020-11-01 NOTE — Telephone Encounter (Signed)
Are you willing to do a 90 day supply of questran for this patient? Let me know and ill fix it and you want her to have 1 or 2 refills?

## 2020-11-18 ENCOUNTER — Institutional Professional Consult (permissible substitution): Payer: PRIVATE HEALTH INSURANCE | Admitting: Neurology

## 2020-11-19 MED ORDER — CHOLESTYRAMINE 4 G PO PACK: 4.0000 g | PACK | Freq: Every day | ORAL | 6 refills | Status: DC

## 2020-11-19 NOTE — Addendum Note (Signed)
Addended by: Latricia Heft A on: 11/19/2020 04:16 PM   Modules accepted: Orders

## 2020-11-22 ENCOUNTER — Ambulatory Visit (INDEPENDENT_AMBULATORY_CARE_PROVIDER_SITE_OTHER): Payer: Medicare Other | Admitting: Neurology

## 2020-11-22 DIAGNOSIS — G4733 Obstructive sleep apnea (adult) (pediatric): Secondary | ICD-10-CM

## 2020-11-22 DIAGNOSIS — G4719 Other hypersomnia: Secondary | ICD-10-CM

## 2020-11-22 DIAGNOSIS — R0683 Snoring: Secondary | ICD-10-CM

## 2020-11-25 NOTE — Progress Notes (Signed)
[facility] SLEEP TEST ( by Watch PAT) REPORT  STUDY DATE: 11-22-20/ data 11-25-2020  DOB: 19-Dec-1950  MRN: [id number]  ORDERING CLINICIAN: Melvyn Novas, MD   REFERRING CLINICIANDon Broach, [facility] [place], NP   CLINICAL INFORMATION/HISTORY: Brandi Potter  has a past medical history of  Hemorrhoids, High cholesterol, Hernia, leg pain/History of colon polyps, Hypertension, CKD 3 ( no more celebrex) ,  Hypothyroidism ( on synthroid) , and RA (rheumatoid arthritis) (HCC). Less hypertension since her retirement. She wakes up refreshed but is a loud snorer. She takes sometimes 3-4 naps, her sleep routines have deteriorated since retirement.   Epworth sleepiness score: 8/24. FSS at 22/63 points  BMI: 32.7 kg/m  Neck Circumference: 14 "  FINDINGS:   Sleep Summary:   Total Recording Time (hours, min): 10 h 4 min  Total Sleep Time (hours, min):  8 h 4 min   Percent REM (%):    15.19 %   Respiratory Indices:   Calculated pAHI (per hour):  19.9/hour        REM pAHI:    5.4/hour      NREM pAHI: 22.4/hour Supine AHI: 22.3/ hour Non Supine AHI: 15.9/h  Oxygen Saturation Statistics:    Oxygen Saturation (%) Mean: 95%  Minimum oxygen saturation (%):       73%  O2 Saturation Range (%):                73-99%    O2 Saturation (minutes) <=88%: 0.2 min  Pulse Rate Statistics:   Pulse Mean (bpm):    53/min  Pulse Range was 39-84 bpm   IMPRESSION: This HST indicated the presence of moderate SA ( sleep apnea) at AHI of 19.9/h and intermittent bradycardia , and loud snoring. The positional component was mild at best. Concerning is the NREM dominance of apnea, which can indicate a central apnea form.   RECOMMENDATION: I recommend to start with auto CPAP 6-16 cm water, 2 cm EPR and heated humidification, interface to be fitted in person and patient needs to be advised to contact DME within 30 days should problems with mask arise.    INTERPRETING  PHYSICIAN:  Melvyn Novas, MD    [facility] Sleep Board certified by The [employer] of Sleep Medicine and Diplomate of the [employer] of Sleep Medicine. Board certified In Neurology through the ABPN, Fellow of the [employer] of Neurology. Medical Director of [facility].  Sleep Summary    Start Study Time: End Study Time: Total Recording Time:        10:34:13 PM 8:38:14 AM 10 h, 4 min  Total Sleep Time % REM of Sleep Time:  8 h, 4 min  15.2      Respiratory Indices     Total Events REM NREM All Night  pRDI: pAHI 3%: ODI 4%: pAHIc 3%: % CSR: pAHI 4%: 178 152   45  42 0.0 67 8.1 5.4 2.7 0.9 0 25.9 22.3 6.4 6.3 23.3 19.9 5.9 5.5 0.0 8.8      Oxygen Saturation Statistics   Mean: 95 Minimum: 73 Maximum: 99  Mean of Desaturations Nadirs (%):   92  Oxygen Desatur. %:  4-9 10-20 >20 Total  Events Number Total   41  2 91.1 4.4  2 4.4  45 100.0  Oxygen Saturation: <90 <89 <85 <80 <70  Duration (minutes): Sleep % 0.2 0.0 0.2 0.1 0.0 0.0 0.1 0.0 0.0  0.0      Pulse Rate Statistics during Sleep (BPM)     Mean: 53 Minimum: 39 Maximum: 84       Body Position Statistics  Position Supine Prone Right Left Non-Supine  Sleep (min) 296.0 0.0 113.5 74.5 188.0  Sleep % 61.2 0.0 23.4 15.4 38.8  pRDI 26.8 N/A 19.7 14.7 17.6  pAHI 3% 22.3 N/A 17.9 13.1 15.9  ODI 4% 8.9 N/A 1.2 0.8 1.0   Left  Right Supine   Snoring Statistics Snoring Level (dB) >40 >50 >60 >70 >80 >Threshold (45)  Sleep (min) 229.4 69.1 38.3 0.0 0.0 113.9  Sleep % 47.4 14.3 7.9 0.0 0.0 23.5   Mean: 45 dB

## 2020-12-02 ENCOUNTER — Telehealth: Payer: Self-pay | Admitting: Neurology

## 2020-12-02 DIAGNOSIS — G4719 Other hypersomnia: Secondary | ICD-10-CM | POA: Insufficient documentation

## 2020-12-02 DIAGNOSIS — R0683 Snoring: Secondary | ICD-10-CM | POA: Insufficient documentation

## 2020-12-02 NOTE — Addendum Note (Signed)
Addended by: Melvyn Novas on: 12/02/2020 09:01 AM   Modules accepted: Orders

## 2020-12-02 NOTE — Telephone Encounter (Signed)
-----   Message from Melvyn Novas, MD sent at 12/02/2020  9:00 AM EDT ----- IMPRESSION: This HST indicated the presence of moderate SA ( sleep apnea) at AHI of 19.9/h and intermittent bradycardia , and loud snoring. The positional component was mild at best. Concerning is the NREM dominance of apnea, which can indicate a central apnea form.  RECOMMENDATION: I recommend to start with auto CPAP 6-16 cm water, 2 cm EPR and heated humidification, interface to be fitted in person and patient needs to be advised to contact DME within 30 days should problems with mask arise.   INTERPRETING PHYSICIAN: Melvyn Novas, MD

## 2020-12-02 NOTE — Progress Notes (Signed)
IMPRESSION: This HST indicated the presence of moderate SA ( sleep apnea) at AHI of 19.9/h and intermittent bradycardia , and loud snoring. The positional component was mild at best. Concerning is the NREM dominance of apnea, which can indicate a central apnea form.  RECOMMENDATION: I recommend to start with auto CPAP 6-16 cm water, 2 cm EPR and heated humidification, interface to be fitted in person and patient needs to be advised to contact DME within 30 days should problems with mask arise.   INTERPRETING PHYSICIAN: Melvyn Novas, MD

## 2020-12-02 NOTE — Procedures (Signed)
[facility] SLEEP TEST ( by Watch PAT) REPORT  STUDY DATE: 11-22-20/ data 11-25-2020  DOB: 1950-07-09  MRN: [id number]  ORDERING CLINICIAN: Melvyn Novas, MD   REFERRING CLINICIANDon Broach, [facility] [place], NP   CLINICAL INFORMATION/HISTORY: Brandi Potter  has a past medical history of  Hemorrhoids, High cholesterol, Hernia, leg pain/History of colon polyps, Hypertension, CKD 3 ( no more celebrex) ,  Hypothyroidism ( on synthroid) , and RA (rheumatoid arthritis) (HCC). Less hypertension since her retirement. She wakes up refreshed but is a loud snorer. She takes sometimes 3-4 naps, her sleep routines have deteriorated since retirement.   Epworth sleepiness score: 8-14 /24. FSS at 22/63 points  BMI: 32.7 kg/m  Neck Circumference: 14 "  FINDINGS:   Sleep Summary:   Total Recording Time (hours, min): 10 h 4 min  Total Sleep Time (hours, min):  8 h 4 min   Percent REM (%):    15.19 %   Respiratory Indices:   Calculated pAHI (per hour):  19.9/hour        REM pAHI:    5.4/hour      NREM pAHI: 22.4/hour Supine AHI: 22.3/ hour Non Supine AHI: 15.9/h  Oxygen Saturation Statistics:    Oxygen Saturation (%) Mean: 95%  Minimum oxygen saturation (%):       73%  O2 Saturation Range (%):                73-99%    O2 Saturation (minutes) <=88%: 0.2 min  Pulse Rate Statistics:   Pulse Mean (bpm):    53/min  Pulse Range was 39-84 bpm   IMPRESSION: This HST indicated the presence of moderate SA ( sleep apnea) at AHI of 19.9/h and intermittent bradycardia , and loud snoring. The positional component was mild at best. Concerning is the NREM dominance of apnea, which can indicate a central apnea form.   RECOMMENDATION: I recommend to start with auto CPAP 6-16 cm water, 2 cm EPR and heated humidification, interface to be fitted in person and patient needs to be advised to contact DME within 30 days should problems with mask arise.    INTERPRETING  PHYSICIAN:  Melvyn Novas, MD    [facility] Sleep Board certified by The [employer] of Sleep Medicine and Diplomate of the [employer] of Sleep Medicine. Board certified In Neurology through the ABPN, Fellow of the [employer] of Neu[employer]. Medical Director of [facility].  Sleep Summary    Start Study Time: End Study Time: Total Recording Time:        10:34:13 PM 8:38:14 AM 10 h, 4 min  Total Sleep Time % REM of Sleep Time:  8 h, 4 min  15.2      Respiratory Indices     Total Events REM NREM All Night  pRDI: pAHI 3%: ODI 4%: pAHIc 3%: % CSR: pAHI 4%: 178 152   45  42 0.0 67 8.1 5.4 2.7 0.9 0 25.9 22.3 6.4 6.3 23.3 19.9 5.9 5.5 0.0 8.8      Oxygen Saturation Statistics   Mean: 95 Minimum: 73 Maximum: 99  Mean of Desaturations Nadirs (%):   92  Oxygen Desatur. %:  4-9 10-20 >20 Total  Events Number Total   41  2 91.1 4.4  2 4.4  45 100.0  Oxygen Saturation: <90 <89 <85 <80 <70  Duration (minutes): Sleep % 0.2 0.0 0.2 0.1 0.0 0.0 0.1 0.0 0.0  0.0      Pulse Rate Statistics during Sleep (BPM)     Mean: 53 Minimum: 39 Maximum: 84       Body Position Statistics  Position Supine Prone Right Left Non-Supine  Sleep (min) 296.0 0.0 113.5 74.5 188.0  Sleep % 61.2 0.0 23.4 15.4 38.8  pRDI 26.8 N/A 19.7 14.7 17.6  pAHI 3% 22.3 N/A 17.9 13.1 15.9  ODI 4% 8.9 N/A 1.2 0.8 1.0   Left  Right Supine   Snoring Statistics Snoring Level (dB) >40 >50 >60 >70 >80 >Threshold (45)  Sleep (min) 229.4 69.1 38.3 0.0 0.0 113.9  Sleep % 47.4 14.3 7.9 0.0 0.0 23.5   Mean: 45 dB

## 2020-12-02 NOTE — Telephone Encounter (Signed)
I called pt. I advised pt that Dr. Vickey Huger reviewed their sleep study results and found that pt has moderate OSA. Dr. Vickey Huger recommends that pt starts auto CPAP. I reviewed PAP compliance expectations with the pt. Pt is agreeable to starting a CPAP. I advised pt that an order will be sent to a DME, [facility] and [facility] will call the pt within about one week after they file with the pt's insurance. [facility]) will show the pt how to use the machine, fit for masks, and troubleshoot the CPAP if needed. A follow up appt was made for insurance purposes with Dr. Vickey Huger on 03/09/21 at 2:30 pm. Pt verbalized understanding to arrive 15 minutes early and bring their CPAP. A letter with all of this information in it will be mailed to the pt as a reminder. I verified with the pt that the address we have on file is correct. Pt verbalized understanding of results. Pt had no questions at this time but was encouraged to call back if questions arise. I have sent the order to [facility]) and have received confirmation that they have received the order.

## 2021-01-10 ENCOUNTER — Telehealth: Payer: Self-pay | Admitting: Neurology

## 2021-01-10 NOTE — Telephone Encounter (Signed)
Called the patient back. She wanted to let me know that she had not heard back from anyone at the company. Advised I would reach out to the manager about her order that was send 6/16. She also confirmed that the 9/21 apt was cancelled. She will call once she has an apt with the DME company to get machine. She will call to schedule the initial cpap follow up 31-90 days from the date scheduled to get the machine.

## 2021-01-10 NOTE — Telephone Encounter (Signed)
Spoke with [facility] and they will get in touch with the patient and see about getting her set up In the [place] location

## 2021-01-10 NOTE — Telephone Encounter (Signed)
Pt called, would like a call from the nurse to discuss letter received in mail on June 14.  Pt would elaborate reason for the call.

## 2021-01-18 ENCOUNTER — Telehealth: Payer: Self-pay | Admitting: Neurology

## 2021-01-18 NOTE — Telephone Encounter (Signed)
Pt called and LVM yesterday 01/17/21 stating that she would like the RN to call her back or text her outside of the hours of 10am-3pm Mon-Thurs. due to being at work because she would like to ask her a question.

## 2021-01-18 NOTE — Telephone Encounter (Signed)
Has question about sleep study test. Pt requesting a call back.

## 2021-01-18 NOTE — Telephone Encounter (Signed)
Called pt back. She wanted to know what her RDI # was from HST. Advised this was not broken down by MD on her HST. Relayed AHI 19.9. Total recorded time: 10hr 4 min and total sleep time 8hr . She verbalized understanding and appreciation.

## 2021-02-15 ENCOUNTER — Telehealth: Payer: Self-pay | Admitting: Neurology

## 2021-02-15 NOTE — Telephone Encounter (Signed)
Pt called to schedule her initial CPAP f/u. She started using the machine on 02/12/2021. Scheduled her on 03/14/2021 pt wants to make sure that is okay and giving enough time. Pt requesting a call back.

## 2021-02-15 NOTE — Telephone Encounter (Signed)
Pt was scheduled 03/24/2021 at 7:45 am with Amy

## 2021-03-09 ENCOUNTER — Ambulatory Visit: Payer: Self-pay | Admitting: Neurology

## 2021-03-14 ENCOUNTER — Ambulatory Visit: Payer: Medicare Other | Admitting: Family Medicine

## 2021-03-24 ENCOUNTER — Ambulatory Visit: Payer: Medicare Other | Admitting: Family Medicine

## 2021-04-09 IMAGING — US US RENAL
1 series · 14 of 25 positions shown · non-contrast
Comparison: None.

CLINICAL DATA: Chronic renal disease.

EXAM:
RENAL / URINARY TRACT ULTRASOUND COMPLETE

[Series 1: us renal · 0.25mm/px · 14 of 40 slices shown]
[im 1/40]
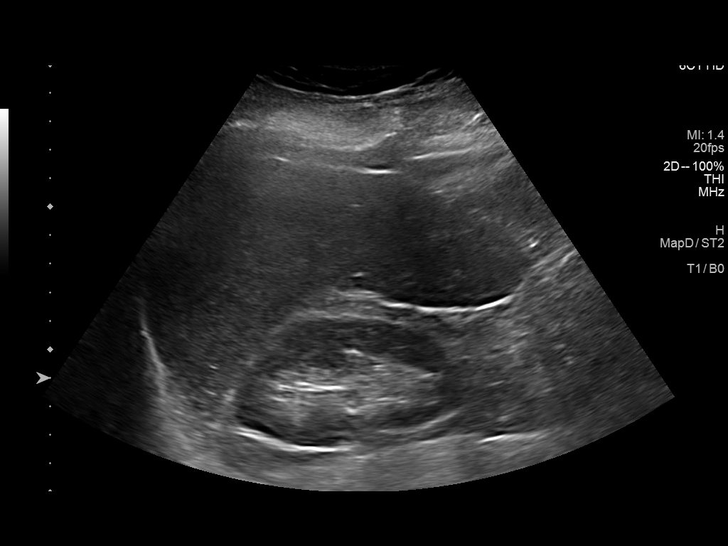
[im 4/40]
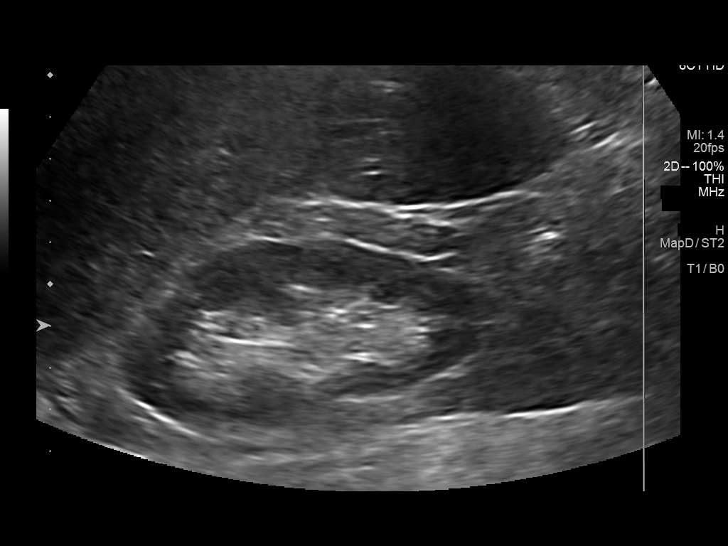
[im 7/40]
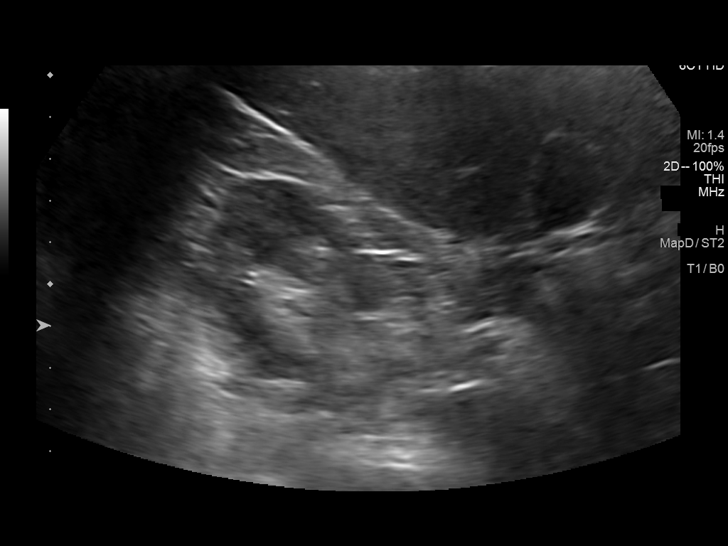
[im 10/40]
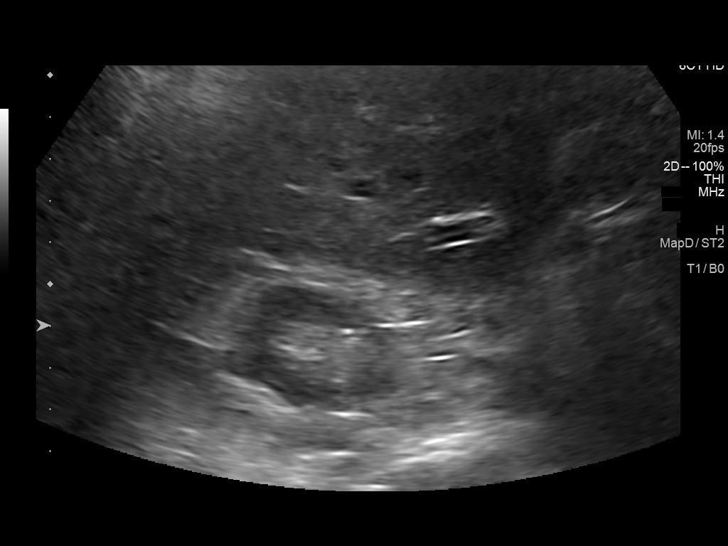
[im 14/40]
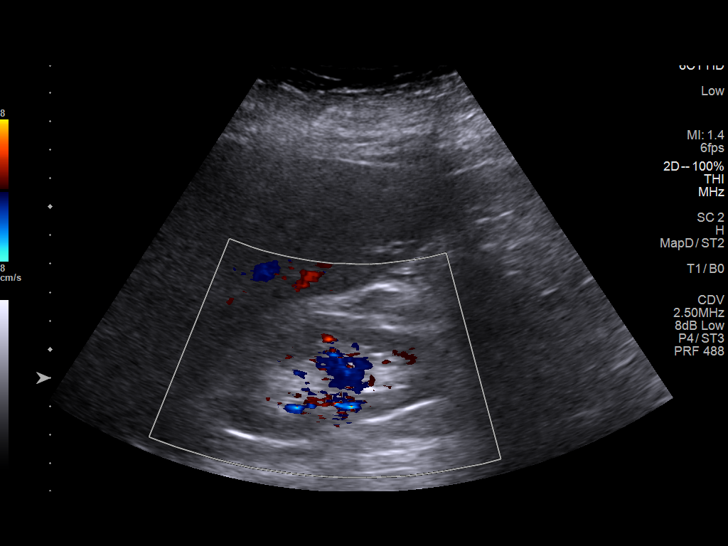
[im 15/40]
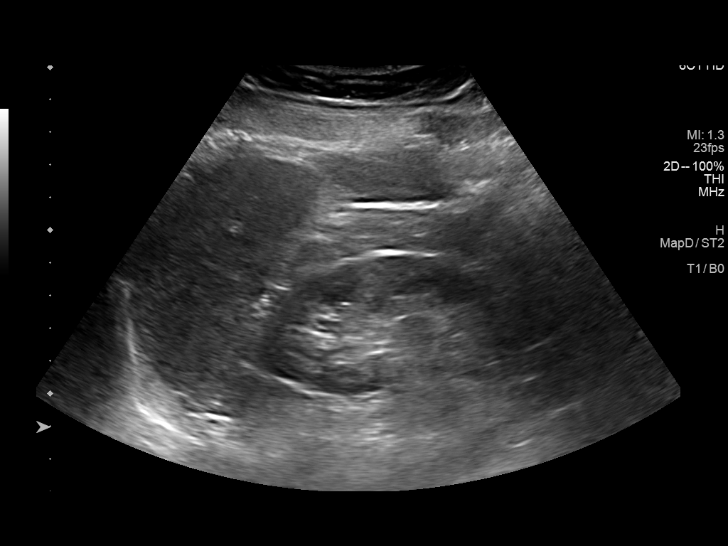
[im 18/40]
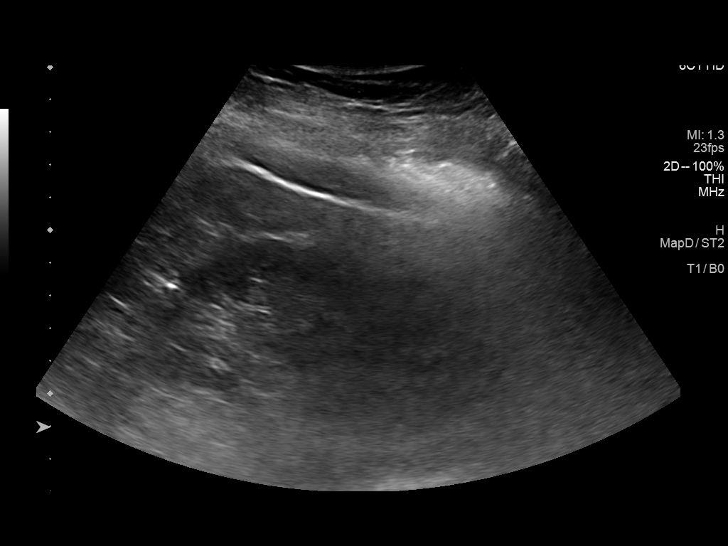
[im 22/40]
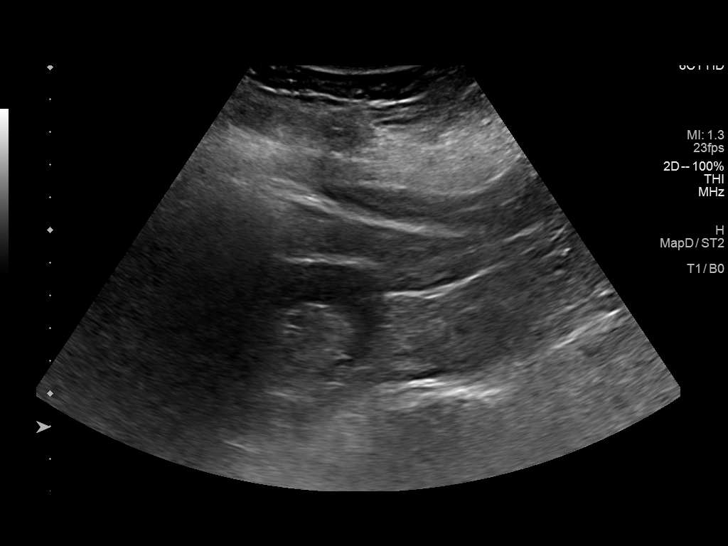
[im 25/40]
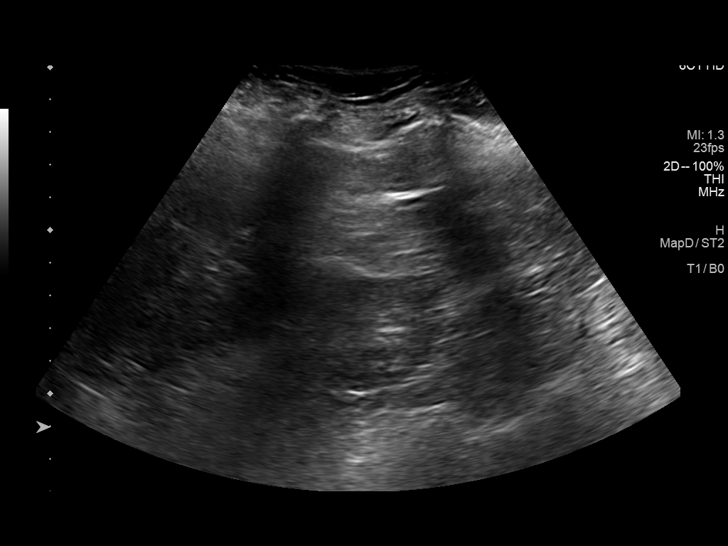
[im 27/40]
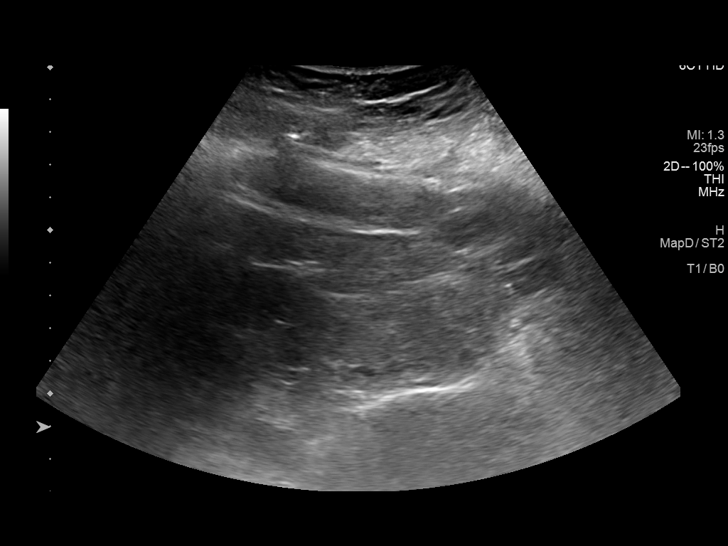
[im 30/40]
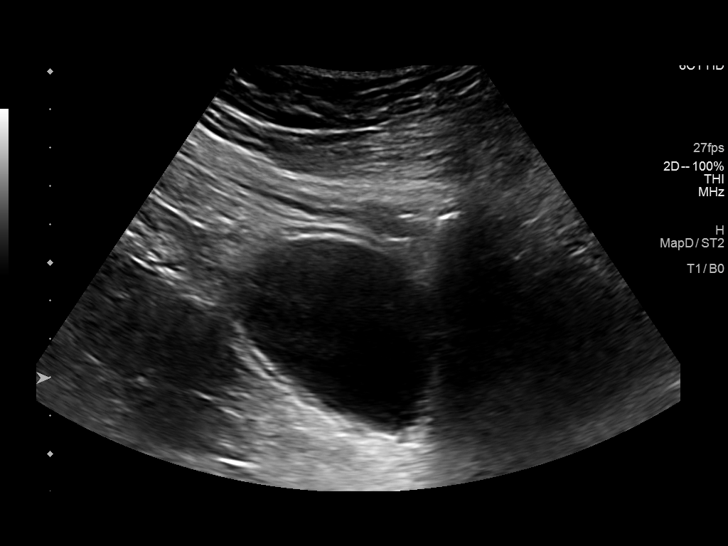
[im 33/40]
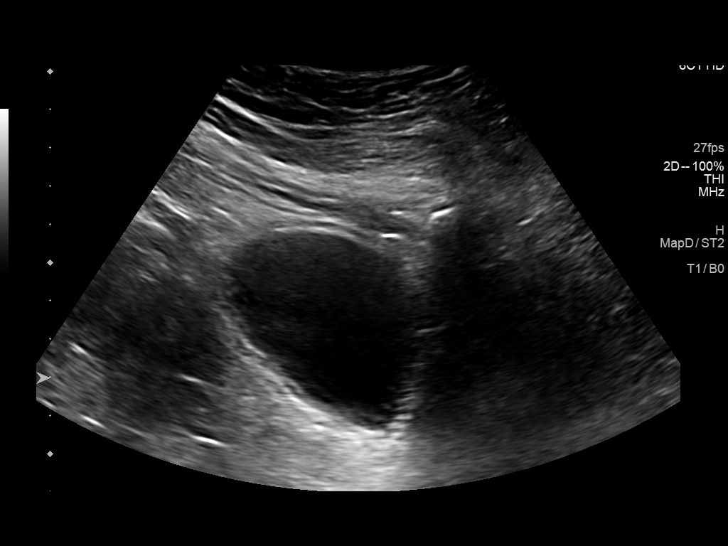
[im 36/40]
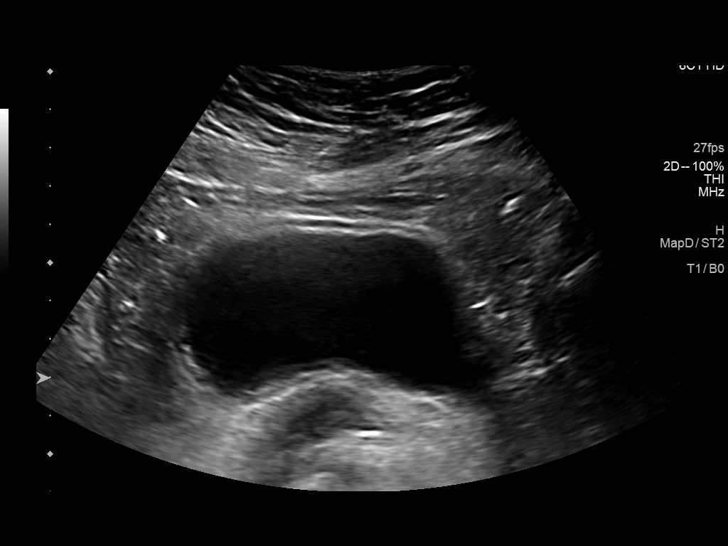
[im 40/40]
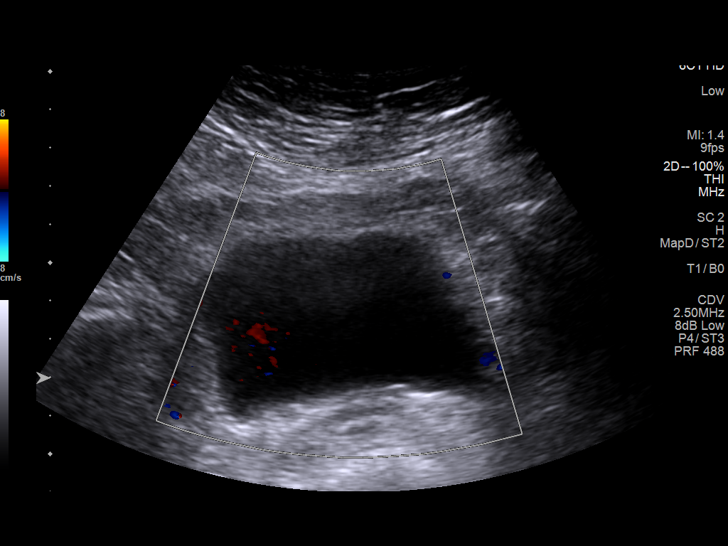

[14 of 25 positions shown; findings below may reference images not displayed]

FINDINGS: Right Kidney:

Renal measurements: 8.7 x 4.1 x 3.4 cm = volume: 62.4 mL.
Echogenicity within normal limits. No mass or hydronephrosis
visualized.

Left Kidney:

Renal measurements: 9.1 x 4.2 x 4.4 cm = volume: 87.0 mL.
Echogenicity within normal limits. No mass or hydronephrosis
visualized.

Bladder:

Appears normal for degree of bladder distention.

Other:

None.
IMPRESSION: No hydronephrosis.

## 2021-04-14 NOTE — Patient Instructions (Addendum)

## 2021-04-14 NOTE — Progress Notes (Signed)
PATIENT: Brandi Potter DOB: 1951/02/23  REASON FOR VISIT: follow up HISTORY FROM: patient  Chief Complaint  Patient presents with   Follow-up    Pt alone, rm 11. Pt states that overall she feels much better since using the machine. Doesn't require a nap anymore. DME: [employer] set up with a Luna machine     HISTORY OF PRESENT ILLNESS:  04/18/21 ALL:  Brandi Potter is a 70 y.o. female here today for follow up for OSA on CPAP.  She was seen in consult with Dr Vickey Huger 09/2020 for loud snoring. HST 11/2020 showed total AHI 19.9/hr and O2 nadir of 73%. She is doing very well on CPAP therapy. She is sleeping better. Waking more refreshed. She is sleeping through the night. No longer yawning throughout day or needing naps. She is tolerating therapy well.   See note from RN for complete report.     HISTORY: (copied from Dr Dohmeier's previous note)  Brandi Potter is a 59 - year- old Caucasian female patient seen here as a referral on 10/14/2020 from [place], NP    Chief concern according to patient :  Presents today to discuss sleepiness concerns. She is recently retired and she feels most of these concerns are r/t boredom or being able to do what she wants. She has never had a SS, has been told she snores - her neighbor stated she can hear her snoring-  Avg 7-9 hours of sleep, wakes up refreshed.  She can do what she needs to do - but she feels a little without structure-  when she gets home take 3-4 hour nap. Decrease in energy level, hearing loss, leg pain, hernia , and close family stressors.    MIRIYA Potter  has a past medical history of Anal fissure, Hemorrhoids, High cholesterol, Hernia, leg pain- History of colon polyps, Hypertension, CKD 3 ( no more celebrex) ,  Hypothyroidism ( on synthroid) , and RA (rheumatoid arthritis) (HCC). Less hypertension after retirement.    Sleep relevant medical history: Snoring, joint pain, Tonsillectomy- adenoid, hearing loss.   nocturia 1-2 .  Family medical /sleep history: No other family member on CPAP with OSA, insomnia, sleep walkers.    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  The patient was an Film/video editor- Pets are not present. Tobacco use- 30 years ago.  ETOH use; socially- seldomly,  Caffeine intake in form of Coffee( /) Soda( *4 a day ) Tea ( or / sweet) or energy drinks. Regular exercise in form of walking.     Sleep habits are as follows: The patient's dinner time is between 5-6 PM. The patient goes to bed at 11.30 PM and continues to sleep for 4-7 hours, wakes for 1-2 bathroom breaks, the first time at 2 AM.   Watches TV in the den- none in Bed. Bedroom is cool, quiet and dark.  The preferred sleep position is sideways , with the support of 2-3 pillows. Bodypillow, knee pillow, 1  Dreams are reportedly frequent- recently some night mares. Marland Kitchen  7 AM is the usual rise time. The patient wakes up with an alarm.  She reports often feeling refreshed - restored in AM, with symptoms such as dry mouth 9 if she was sleeping on her back  ,  She was a mouth breather before  tonsillectomy- no morning headaches , and residual fatigue.  Naps are taken frequently, lasting hours and are interfering  nocturnal sleep.   REVIEW OF SYSTEMS: Out of a complete 14 system review of symptoms, the patient complains only of the following symptoms, and all other reviewed systems are negative.  ESS: previously 8/24, now 4/24  ALLERGIES: Allergies  Allergen Reactions   Aleve [Naproxen Sodium] Hives    HOME MEDICATIONS: Outpatient Medications Prior to Visit  Medication Sig Dispense Refill   Calcium Citrate (CITRACAL PO) Take by mouth.     cholestyramine (QUESTRAN) 4 g packet Take 1 packet (4 g total) by mouth daily. 30 packet 6   levothyroxine (SYNTHROID) 88 MCG tablet Take  88 mcg by mouth daily before breakfast.     progesterone (PROMETRIUM) 200 MG capsule Take 200 mg by mouth daily.     sertraline (ZOLOFT) 25 MG tablet Take 12.5 mg by mouth daily.      No facility-administered medications prior to visit.    PAST MEDICAL HISTORY: Past Medical History:  Diagnosis Date   Anal fissure    Hemorrhoids    High cholesterol    History of colon polyps    Hypertension    Hypothyroidism    RA (rheumatoid arthritis) (HCC)     PAST SURGICAL HISTORY: Past Surgical History:  Procedure Laterality Date   COLONOSCOPY  05/08/2014   Dr Jennye Boroughs. Mild diverticular disease left colon. External hemorrhoids.   ESOPHAGOGASTRODUODENOSCOPY  05/08/2014   Dr Jennye Boroughs. Normal upper endoscopy. Empiric esophageal dilation to 40 French   HEMORRHOID SURGERY  1999   Dr Caryl Asp   HERNIA REPAIR  08/14/2019   periumbillical hernia   TONSILLECTOMY      FAMILY HISTORY: Family History  Problem Relation Age of Onset   Cancer Father        nasopharyngeal   Colon cancer Neg Hx     SOCIAL HISTORY: Social History   Socioeconomic History   Marital status: Married    Spouse name: Not on file   Number of children: Not on file   Years of education: Not on file   Highest education level: Not on file  Occupational History   Not on file  Tobacco Use   Smoking status: Former   Smokeless tobacco: Never  Vaping Use   Vaping Use: Never used  Substance and Sexual Activity   Alcohol use: Yes    Comment: ocassionally   Drug use: Not Currently   Sexual activity: Not on file  Other Topics Concern   Not on file  Social History Narrative   Not on file   Social Determinants of Health   Financial Resource Strain: Not on file  Food Insecurity: Not on file  Transportation Needs: Not on file  Physical Activity: Not on file  Stress: Not on file  Social Connections: Not on file  Intimate Partner Violence: Not on file     PHYSICAL EXAM  Vitals:   04/18/21 1311  BP:  (!) 143/76  Pulse: (!) 52  Weight: 217 lb (98.4 kg)  Height: 5\' 7"  (1.702 m)   Body mass index is 33.99 kg/m.  Generalized: Well developed, in no acute distress  Cardiology: normal rate and rhythm, no murmur noted Respiratory: clear to auscultation bilaterally  Neurological examination  Mentation: Alert oriented to time, place, history taking. Follows all commands speech and language fluent Cranial nerve II-XII: Pupils were equal round reactive to light. Extraocular movements were full, visual field were full  Motor: The motor  testing reveals 5 over 5 strength of all 4 extremities. Good symmetric motor tone is noted throughout.  Gait and station: Gait is normal.    DIAGNOSTIC DATA (LABS, IMAGING, TESTING) - I reviewed patient records, labs, notes, testing and imaging myself where available.  No flowsheet data found.   No results found for: WBC, HGB, HCT, MCV, PLT No results found for: NA, K, CL, CO2, GLUCOSE, BUN, CREATININE, CALCIUM, PROT, ALBUMIN, AST, ALT, ALKPHOS, BILITOT, GFRNONAA, GFRAA No results found for: CHOL, HDL, LDLCALC, LDLDIRECT, TRIG, CHOLHDL No results found for: ZDGL8V No results found for: VITAMINB12 No results found for: TSH   ASSESSMENT AND PLAN 70 y.o. year old female  has a past medical history of Anal fissure, Hemorrhoids, High cholesterol, History of colon polyps, Hypertension, Hypothyroidism, and RA (rheumatoid arthritis) (HCC). here with     ICD-10-CM   1. OSA on CPAP  G47.33    Z99.89         ERMALINDA JOUBERT is doing well on CPAP therapy. Compliance report reveals excellent daily and 4 hour usage. She was encouraged to continue using CPAP nightly and for greater than 4 hours each night. We will update supply orders as indicated. Risks of untreated sleep apnea review and education materials provided. Healthy lifestyle habits encouraged. She will follow up in 1 year, sooner if needed. She verbalizes understanding and agreement with this plan.    No  orders of the defined types were placed in this encounter.    No orders of the defined types were placed in this encounter.     Shawnie Dapper, FNP-C 04/18/2021, 1:37 PM [facility] [address] [place], [place] [address] [phone]

## 2021-04-18 ENCOUNTER — Encounter: Payer: Self-pay | Admitting: Family Medicine

## 2021-04-18 ENCOUNTER — Ambulatory Visit: Payer: Medicare Other | Admitting: Family Medicine

## 2021-04-18 VITALS — BP 143/76 | HR 52 | Ht 67.0 in | Wt 217.0 lb

## 2021-04-18 DIAGNOSIS — G4733 Obstructive sleep apnea (adult) (pediatric): Secondary | ICD-10-CM

## 2021-04-18 DIAGNOSIS — Z9989 Dependence on other enabling machines and devices: Secondary | ICD-10-CM

## 2021-04-19 ENCOUNTER — Ambulatory Visit: Payer: Medicare Other | Admitting: Family Medicine

## 2021-06-06 ENCOUNTER — Ambulatory Visit: Payer: Medicare Other | Admitting: Family Medicine

## 2021-06-22 ENCOUNTER — Telehealth: Payer: Self-pay | Admitting: Gastroenterology

## 2021-06-22 NOTE — Telephone Encounter (Signed)
Left message for pt to call back  °

## 2021-06-22 NOTE — Telephone Encounter (Signed)
Inbound call from patient states that pharmacy will not refill her 3 month supply for Cholestyramine due to Dr. Chales Abrahams canceling refill. Seeking advice, please advise.   Leave a message just in case patient does not answer, will be at work.

## 2021-06-23 ENCOUNTER — Other Ambulatory Visit: Payer: Self-pay

## 2021-06-23 DIAGNOSIS — R152 Fecal urgency: Secondary | ICD-10-CM

## 2021-06-23 DIAGNOSIS — K581 Irritable bowel syndrome with constipation: Secondary | ICD-10-CM

## 2021-06-23 MED ORDER — CHOLESTYRAMINE 4 G PO PACK: 4.0000 g | PACK | Freq: Every day | ORAL | 0 refills | Status: DC

## 2021-06-23 NOTE — Telephone Encounter (Signed)
Pt states that she is no longer able to get the Cholestyramine from the pharmacy due to the prescription. Prescription sent in  for one month supply . Pt made aware Pt scheduled for a follow up appointment on 07/14/2021 at 4:00 with Dr. Chales Abrahams in [facility]. Pt made aware.  Pt verbalized understanding with all questions answered.

## 2021-07-14 ENCOUNTER — Ambulatory Visit: Payer: Medicare Other | Admitting: Gastroenterology

## 2021-07-14 ENCOUNTER — Other Ambulatory Visit: Payer: Self-pay

## 2021-07-14 ENCOUNTER — Encounter: Payer: Self-pay | Admitting: Gastroenterology

## 2021-07-14 VITALS — BP 142/80 | HR 64 | Ht 68.0 in | Wt 223.0 lb

## 2021-07-14 DIAGNOSIS — R1032 Left lower quadrant pain: Secondary | ICD-10-CM

## 2021-07-14 DIAGNOSIS — K580 Irritable bowel syndrome with diarrhea: Secondary | ICD-10-CM | POA: Diagnosis not present

## 2021-07-14 DIAGNOSIS — R159 Full incontinence of feces: Secondary | ICD-10-CM

## 2021-07-14 MED ORDER — CHOLESTYRAMINE 4 G PO PACK: 4.0000 g | PACK | Freq: Every day | ORAL | 4 refills | Status: DC

## 2021-07-14 NOTE — Patient Instructions (Addendum)
If you are age 71 or older, your body mass index should be between 23-30. Your Body mass index is 33.91 kg/m. If this is out of the aforementioned range listed, please consider follow up with your Primary Care Provider.  __________________________________________________________  The [facility] providers would like to encourage you to use [facility] to communicate with providers for non-urgent requests or questions.  Due to long hold times on the telephone, sending your provider a message by [facility] may be a faster and more efficient way to get a response.  Please allow 48 business hours for a response.  Please remember that this is for non-urgent requests.   Due to recent changes in healthcare laws, you may see the results of your imaging and laboratory studies on MyChart before your provider has had a chance to review them.  We understand that in some cases there may be results that are confusing or concerning to you. Not all laboratory results come back in the same time frame and the provider may be waiting for multiple results in order to interpret others.  Please give [place] 48 hours in order for your provider to thoroughly review all the results before contacting the office for clarification of your results.   -We have sent the following medications to your pharmacy for you to pick up at your convenience: Cholestryamine - Continue Cholestryamine 4g po qd. 2 hours before or after rest of the medications. - Continue Oscal 500mg  everyday.   It was a pleasure to see you today!  Jackquline Denmark, M.D.

## 2021-07-14 NOTE — Progress Notes (Signed)
Chief Complaint: FU  Referring Provider:  Pc, [facility]*      ASSESSMENT AND PLAN;   #1. Rectal incontinence (resolved). Neg colon 04/2014 as below.  #2. IBS-D  #3. LLQ pain-likely musculoskeletal (resolved). R/O other causes. Neg CT pelvis 07/2019  Plan:  - Continue Cholestryamine 4g po qd. 2 hours before or after rest of the medications #90, 4 refills - Continue Oscal 500mg  po qd - Recall colon 04/2024. - FU PRN   HPI:    Brandi Potter is a 71 y.o. female  With HTN, HLD, ?Glucose intolerance (per records sent to 66, no mention of diabetes), OSA on CPAP, CKD retired from [place].  She denies having any problems with urine incontinence.  Much better now ever since she has been on Questran.  Here for medication refill.  Would occasionally get constipated.  No nausea, vomiting, heartburn, regurgitation, odynophagia or dysphagia.  No significant diarrhea or constipation. No melena or hematochezia. No unintentional weight loss. No abdominal or rectal pain.  No bloating.  No sodas, chocolates, chewing gums, artificial sweeteners and candy. No other OTC NSAIDs   Past GI procedures: -Colonoscopy (Dr [employer]) 05/08/2014: Mild left colonic diverticulosis, external hemorrhoids.  Repeat in 10 years.  Colonoscopy 02/2004: Diminutive colonic polyps s/p polypectomy, small internal and external hemorrhoids. Bx-hyperplastic. -EGD 05/08/2014 (Dr 05/10/2014): neg. S/P dilatation 54 Fr -CT pelvis without contrast (not CT abdomen) 08/08/2019 periumbilical hernia, degenerative joint disease. Past Medical History:  Diagnosis Date   Anal fissure    Hemorrhoids    High cholesterol    History of colon polyps    Hypertension    Hypothyroidism    RA (rheumatoid arthritis) (HCC)    Stage 3a chronic kidney disease (CKD) (HCC) 2022   [facility] 2023    Past Surgical History:  Procedure Laterality Date   COLONOSCOPY  05/08/2014   Dr 05/10/2014. Mild diverticular disease left  colon. External hemorrhoids.   ESOPHAGOGASTRODUODENOSCOPY  05/08/2014   Dr 05/10/2014. Normal upper endoscopy. Empiric esophageal dilation to 19 French   HEMORRHOID SURGERY  1999   Dr 2000   HERNIA REPAIR  08/14/2019   periumbillical hernia   TONSILLECTOMY      Family History  Problem Relation Age of Onset   Cancer Father        nasopharyngeal   Colon cancer Neg Hx     Social History   Tobacco Use   Smoking status: Former   Smokeless tobacco: Never  Vaping Use   Vaping Use: Never used  Substance Use Topics   Alcohol use: Yes    Comment: ocassionally   Drug use: Not Currently    Current Outpatient Medications  Medication Sig Dispense Refill   Calcium Citrate (CITRACAL PO) Take by mouth.     cholestyramine (QUESTRAN) 4 g packet Take 1 packet (4 g total) by mouth daily. 30 each 0   levothyroxine (SYNTHROID) 88 MCG tablet Take 88 mcg by mouth daily before breakfast.     progesterone (PROMETRIUM) 200 MG capsule Take 200 mg by mouth daily.     sertraline (ZOLOFT) 25 MG tablet Take 12.5 mg by mouth daily.      No current facility-administered medications for this visit.    Allergies  Allergen Reactions   Aleve [Naproxen Sodium] Hives    Review of Systems:  Constitutional: Denies fever, chills, diaphoresis, appetite change and fatigue.   Psychiatric/Behavioral: Has anxiety or depression     Physical Exam:    BP 08/16/2019)  142/80    Pulse 64    Ht 5\' 8"  (1.727 m)    Wt 223 lb (101.2 kg)    BMI 33.91 kg/m  Wt Readings from Last 3 Encounters:  07/14/21 223 lb (101.2 kg)  04/18/21 217 lb (98.4 kg)  10/14/20 216 lb (98 kg)   Constitutional:  Well-developed, in no acute distress. Abdominal: Soft, nondistended. Nontender.  Well-healed surgical scars.  Bowel sounds active throughout. There are no masses palpable. No hepatomegaly. Rectal: Seen in presence of [place] CMA.  Small external hemorrhoids, good rectal tone.  Stool brown heme-negative. Neurological: Alert and  oriented to person place and time. Skin: Skin is warm and dry. No rashes noted.   10/16/20, MD 07/14/2021, 3:55 PM  Cc: Pc, [facility]*

## 2022-03-02 ENCOUNTER — Ambulatory Visit: Payer: Medicare Other | Admitting: Neurology

## 2022-03-02 ENCOUNTER — Encounter: Payer: Self-pay | Admitting: Neurology

## 2022-03-02 VITALS — BP 140/66 | HR 73 | Ht 68.0 in | Wt 226.8 lb

## 2022-03-02 DIAGNOSIS — M65342 Trigger finger, left ring finger: Secondary | ICD-10-CM

## 2022-03-02 DIAGNOSIS — R292 Abnormal reflex: Secondary | ICD-10-CM

## 2022-03-02 DIAGNOSIS — R253 Fasciculation: Secondary | ICD-10-CM

## 2022-03-02 DIAGNOSIS — F1510 Other stimulant abuse, uncomplicated: Secondary | ICD-10-CM | POA: Diagnosis not present

## 2022-03-02 DIAGNOSIS — R278 Other lack of coordination: Secondary | ICD-10-CM

## 2022-03-02 NOTE — Progress Notes (Signed)
SLEEP MEDICINE CLINIC    Provider:  Melvyn Novas, MD  Primary Care Physician:  Jerrye Bushy, FNP [address] [place] [address]     Referring Provider: Jerrye Bushy, Fnp [address] [place],  [place] [address]  [place], NP          Chief Complaint according to patient   Patient presents with:     New Patient (Initial Visit)           HISTORY OF PRESENT ILLNESS:   FREDERICKA BOTTCHER is a 71- year- old Caucasian female patient seen here as a referral on 03/02/2022 from [place], NP . This time she is not here for CPAP and OSA follow up- she has seen Amy Lomax in 04-18-2021 after sleep test from 11-22-2020. This HST indicated the presence of moderate SA ( sleep apnea) at AHI of 19.9/h and intermittent bradycardia , and loud snoring. The positional component was mild at best. Concerning is the NREM dominance of apnea, which can indicate a central apnea form. I recommend to start with auto CPAP 6-16 cm water, 2 cm EPR and heated humidification, interface to be fitted in person and patient needs to be advised to contact DME within 30 days should problems with mask arise. She reached a 88% compliance on her machine and I breeze, with 0.5 AHI/h.more energy , less fatigue, she has less headaches.   She now presents with twitching and jerking movements.  She has quit working her part time job and that relieved some stress, and there has been a divisive  family conflict  that lingers. She has 3 sisters and 2 brothers. She is the middle child. With reduction in stress there has still been no reduction in twitching.  I watched her speaking to me and noted some twitching of the left thumb.       CONSULT NOTE FROM 10-14-2020: Chief concern according to patient :  Presents today to discuss sleepiness concerns. She is recently retired and she feels most of these concerns are r/t boredom or being able to do what she wants. She has never had a SS, has been told she snores - her  neighbor stated she can hear her snoring-  Avg 7-9 hours of sleep, wakes up refreshed.  She can do what she needs to do - but she feels a little without structure-  when she gets home take 3-4 hour nap. Decrease in energy level, hearing loss, leg pain, hernia , and close family stressors.     AISSATA WILMORE  has a past medical history of Anal fissure, Hemorrhoids, High cholesterol, Hernia, leg pain- History of colon polyps, Hypertension, CKD 3 ( no more celebrex) ,  Hypothyroidism ( on synthroid) , and RA (rheumatoid arthritis) (HCC). Less hypertension after retirement.     Sleep relevant medical history: Snoring, joint pain, Tonsillectomy- adenoid, hearing loss.  nocturia 1-2 .  Family medical /sleep history: No other family member on CPAP with OSA, insomnia, sleep walkers.    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  The patient was an Film/video editor- Pets are not present. Tobacco use- 30 years ago.  ETOH use; socially- seldomly,  Caffeine intake in form of Coffee( /) Soda( *4 a day ) Tea ( or / sweet) or energy drinks.  Regular exercise in form of walking.     Sleep habits are as follows: The patient's dinner time is between 5-6 PM. The patient goes to bed at 11.30 PM and continues to sleep for 4-7 hours, wakes for 1-2 bathroom breaks, the first time at 2 AM.   Watches TV in the den- none in Bed. Bedroom is cool, quiet and dark.  The preferred sleep position is sideways , with the support of 2-3 pillows. Bodypillow, knee pillow, 1  Dreams are reportedly frequent- recently some night mares. Marland Kitchen  7 AM is the usual rise time. The patient wakes up with an alarm.  She reports often feeling refreshed - restored in AM, with symptoms such as dry mouth 9 if she was sleeping on her back  ,  She was a mouth breather before tonsillectomy- no morning  headaches , and residual fatigue.  Naps are taken frequently, lasting hours and are interfering  nocturnal sleep.    Review of Systems: Out of a complete 14 system review, the patient complains of only the following symptoms, and all other reviewed systems are negative.:  Fatigue,snoring, fragmented sleep, Insomnia- sleep phase shifting.    How likely are you to doze in the following situations: 0 = not likely, 1 = slight chance, 2 = moderate chance, 3 = high chance   Sitting and Reading? Watching Television? Sitting inactive in a public place (theater or meeting)? As a passenger in a car for an hour without a break? Lying down in the afternoon when circumstances permit? Sitting and talking to someone? Sitting quietly after lunch without alcohol? In a car, while stopped for a few minutes in traffic?   Total = 8/ 24 points  With daily naps....  FSS endorsed at 22/ 63 points.   Social History   Socioeconomic History   Marital status: Married    Spouse name: Not on file   Number of children: Not on file   Years of education: Not on file   Highest education level: Not on file  Occupational History   Not on file  Tobacco Use   Smoking status: Former   Smokeless tobacco: Never  Vaping Use   Vaping Use: Never used  Substance and Sexual Activity   Alcohol use: Yes    Comment: ocassionally   Drug use: Not Currently   Sexual activity: Not on file  Other Topics Concern   Not on file  Social History Narrative   Not on file   Social Determinants of Health   Financial Resource Strain: Not on file  Food Insecurity: Not on file  Transportation Needs: Not on file  Physical Activity: Not on file  Stress: Not on file  Social Connections: Not on file    Family History  Problem Relation Age of Onset   Cancer Father        nasopharyngeal   Colon cancer Neg Hx     Past Medical History:  Diagnosis Date   Anal fissure    Hemorrhoids    High cholesterol    History of colon  polyps    Hypertension    Hypothyroidism    RA (rheumatoid arthritis) (HCC)    Stage 3a chronic kidney disease (CKD) (HCC) 2022   [facility] Dr Melanee Spry    Past Surgical History:  Procedure Laterality Date   COLONOSCOPY  05/08/2014   Dr Jennye Boroughs. Mild diverticular disease left colon. External hemorrhoids.   ESOPHAGOGASTRODUODENOSCOPY  05/08/2014   Dr Jennye Boroughs. Normal upper endoscopy. Empiric  esophageal dilation to 33 [place]   HEMORRHOID SURGERY  1999   Dr Caryl Asp   HERNIA REPAIR  08/14/2019   periumbillical hernia   TONSILLECTOMY       Current Outpatient Medications on File Prior to Visit  Medication Sig Dispense Refill   Calcium Citrate (CITRACAL PO) Take by mouth.     cholestyramine (QUESTRAN) 4 g packet Take 1 packet (4 g total) by mouth daily. 90 each 4   ezetimibe (ZETIA) 10 MG tablet Take 10 mg by mouth daily.     levothyroxine (SYNTHROID) 88 MCG tablet Take 75 mcg by mouth daily before breakfast.     progesterone (PROMETRIUM) 200 MG capsule Take 200 mg by mouth daily.     sertraline (ZOLOFT) 25 MG tablet Take 12.5 mg by mouth daily.      No current facility-administered medications on file prior to visit.    Allergies  Allergen Reactions   Aleve [Naproxen Sodium] Hives    Physical exam:  Today's Vitals   03/02/22 1330  BP: (!) 140/66  Pulse: 73  Weight: 226 lb 12.8 oz (102.9 kg)  Height: 5\' 8"  (1.727 m)   Body mass index is 34.48 kg/m.   Wt Readings from Last 3 Encounters:  03/02/22 226 lb 12.8 oz (102.9 kg)  07/14/21 223 lb (101.2 kg)  04/18/21 217 lb (98.4 kg)     Ht Readings from Last 3 Encounters:  03/02/22 5\' 8"  (1.727 m)  07/14/21 5\' 8"  (1.727 m)  04/18/21 5\' 7"  (1.702 m)      General: The patient is awake, alert and appears not in acute distress. The patient is well groomed. Head: Normocephalic, atraumatic. Neck is supple. Mallampati 2,  neck circumference:14 inches . Nasal airflow  patent.  Retrognathia is not seen-  small jaws and small mouth. .  Dental status: biological  Cardiovascular:  Regular rate and cardiac rhythm by pulse,  without distended neck veins. Respiratory: Lungs are clear to auscultation.  Skin:  Without evidence of ankle edema, or rash. Trunk: The patient's posture is erect.   Neurologic exam : The patient is awake and alert, oriented to place and time.   Memory subjective described as intact.  Attention span & concentration ability appears normal.  Speech is fluent,  without dysarthria, dysphonia or aphasia.  Mood and affect are appropriate.   Cranial nerves: no loss of smell or taste reported  Pupils are equal in size and round-and briskly reactive to light. Funduscopic exam deferred. .  Extraocular movements in vertical and horizontal planes were intact and without nystagmus. No Diplopia. Visual fields by finger perimetry are intact. Hearing was  IMPAIRED to soft voice and finger rubbing.    Facial sensation intact to fine touch. Facial motor strength is symmetric and tongue and uvula move midline.  Neck ROM : rotation, tilt and flexion extension were normal for age and shoulder shrug was symmetrical.    Motor exam:  Symmetric bulk, tone and somewhat restricted neck ROM.    Normal tone without cog- wheeling, reduced grip strength .  Left hand thenar eminence is atrophied, weaker grip than right. That was noted last time- she has now a trigger finger on the left middle finger.     Sensory:  Fine touch and vibration were normal.  Proprioception tested in the upper extremities was impaired -    Coordination: Rapid alternating movements in the fingers/hands were of normal speed.  The Finger-to-nose maneuver was impaired with evidence of dysmetria and mild  tremor.  Dysmetria on the right and tremor on the left.   Tremor at rest is of very fine low amplitude.    Gait and station: Patient could rise unassisted from a seated position, walked without assistive device.  Unsteady  when climbing stairs- and ascending too.  Stance is of normal width/ base and the patient turned with 3 steps.  Toe and heel walk were deferred.  Deep tendon reflexes: in the  upper and lower extremities are very brisk- hyperreflexia.  symmetric and intact.  Babinski response was deferred .       After spending a total time of  45  minutes face to face and for physical and neurologic examination, review of laboratory studies,  personal review of imaging studies, reports and results of other testing and review of referral information / records as far as provided in visit, I have established the following Assessments and Plans   1)  There had been a lot of changes since she entered retirement, and she was free floating- lost her structure for he days, sleeps in and goes to bed late, naps for long hours.  Hypersomnia has resolved, better sleep hygiene and CPAP treatment of OSA .  2) The Tremor is irregular, jerking, I asked her to  reduce caffeine intake 3) Hyperreflexia in both legs , brisk reflexes and twitching were first noted in the left leg-  upper extremity reflexes are very brisk as well. I was concerned to see the dysmetria and wondered about a motor-neuron impairment.  But her tongue doesn't fasciculate. I will ask dr Terrace Arabia for NCS with EMG of the left hand and arm, left leg. I ordered a cervical spine MRI.  I recommended to see a hand surgeon for trigger finger   1) set a morning rise time- avoid naps over 30 minutes- reduce caffeine. This may help headaches further .  2) encourage to look for volunteer job. 3) continue CPAP use.    I would like to thank Jerrye Bushy, FNP and Jerrye Bushy, Fnp [address] [place],  [place] [address] for allowing me to meet with and to take care of this pleasant patient.   In short, ELLIEMAE BRAMAN is presenting with  jerking movements hyperreflexia and twitching.    Electronically signed by: Melvyn Novas, MD 03/02/2022 1:58 PM  [facility] Board certified by The [employer] of Sleep Medicine and Diplomate of the [employer] of Sleep Medicine. Board certified In Neurology through the ABPN, Fellow of the [employer] of Neurology. Medical Director of [facility].

## 2022-03-02 NOTE — Patient Instructions (Signed)
Tremor A tremor is trembling or shaking that you cannot control. Most tremors affect the hands or arms. Tremors can also affect the head, vocal cords, face, and other parts of the body. There are many types of tremors. Common types include: Essential tremor. These usually occur in people older than 40. This type of tremor may run in families and can happen in otherwise healthy people. Resting tremor. These occur when the muscles are at rest, such as when your hands are resting in your lap. People with Parkinson's disease often have resting tremors. Postural tremor. These occur when you try to hold a pose, such as keeping your hands outstretched. Kinetic tremor. These occur during purposeful movement, such as trying to touch a finger to your nose. Task-specific tremor. These may occur when you do certain tasks such as writing, speaking, or standing. Psychogenic tremor. These are greatly reduced or go away when you are distracted. These tremors happen due to underlying stress or psychiatric disease. They can happen in people of all ages. Some types of tremors have no known cause. Tremors can also be a symptom of nervous system problems (neurological disorders) that may occur with aging. Some tremors go away with treatment, while others do not. Follow these instructions at home: Lifestyle     If you drink alcohol: Limit how much you have to: 0-1 drink a day for women who are not pregnant. 0-2 drinks a day for men. Know how much alcohol is in a drink. In the [place] one drink equals one 12 oz bottle of beer (355 mL), one 5 oz glass of wine (148 mL), or one 1 oz glass of hard liquor (44 mL). Do not use any products that contain nicotine or tobacco. These products include cigarettes, chewing tobacco, and vaping devices, such as e-cigarettes. If you need help quitting, ask your health care provider. Avoid extreme heat and extreme cold. Limit your caffeine intake, as told by your health care  provider. Try to get 8 hours of sleep each night. Find ways to manage your stress, such as meditation or yoga. General instructions Take over-the-counter and prescription medicines only as told by your health care provider. Keep all follow-up visits. This is important. Contact a health care provider if: You develop a tremor after starting a new medicine. You have a tremor along with other symptoms such as: Numbness. Tingling. Pain. Weakness. Your tremor gets worse. Your tremor interferes with your day-to-day life. Summary A tremor is trembling or shaking that you cannot control. Most tremors affect the hands or arms. Some types of tremors have no known cause. Others may be a symptom of nervous system problems (neurological disorders). Make sure you discuss any tremors you have with your health care provider. This information is not intended to replace advice given to you by your health care provider. Make sure you discuss any questions you have with your health care provider. Document Revised: 03/25/2021 Document Reviewed: 03/25/2021 Elsevier Patient Education  2023 Elsevier Inc.  

## 2022-03-07 ENCOUNTER — Telehealth: Payer: Self-pay | Admitting: Neurology

## 2022-03-07 NOTE — Telephone Encounter (Signed)
[employer] medicare NPR sent to GI 

## 2022-03-22 ENCOUNTER — Ambulatory Visit
Admission: RE | Admit: 2022-03-22 | Discharge: 2022-03-22 | Disposition: A | Discharge status: Home / Self Care | Payer: Medicare Other | Source: Ambulatory Visit | Attending: Neurology | Admitting: Neurology

## 2022-03-22 DIAGNOSIS — R292 Abnormal reflex: Secondary | ICD-10-CM

## 2022-03-22 DIAGNOSIS — M65342 Trigger finger, left ring finger: Secondary | ICD-10-CM

## 2022-03-22 DIAGNOSIS — R253 Fasciculation: Secondary | ICD-10-CM

## 2022-03-22 DIAGNOSIS — R278 Other lack of coordination: Secondary | ICD-10-CM

## 2022-03-22 DIAGNOSIS — F1510 Other stimulant abuse, uncomplicated: Secondary | ICD-10-CM

## 2022-03-23 NOTE — Progress Notes (Signed)
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 Mild spinal canal stenosis is reported here as well/ At C5-6 with bone spurs and as a result with moderate left foraminal stenosis.   No myelopathy or canal stenosis that would explain the brisk reflexes and muscle twitching.   The left shoulder, neck and upper arm may be affected by a pinched nerve, but there is no explanation for overall hyperreflexia .     Posterior fossa of the skull and pituitary gland were normal.

## 2022-03-27 ENCOUNTER — Telehealth: Payer: Self-pay | Admitting: *Deleted

## 2022-03-27 NOTE — Telephone Encounter (Signed)
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 Mild spinal canal stenosis is reported here as well/ At C5-6 with bone spurs and as a result with moderate left foraminal stenosis.   No myelopathy or canal stenosis that would explain the brisk reflexes and muscle twitching.   The left shoulder, neck and upper arm may be affected by a pinched nerve, but there is no explanation for overall hyperreflexia .     Posterior fossa of the skull and pituitary gland were normal.

## 2022-03-27 NOTE — Telephone Encounter (Signed)
Called and LVM for pt about MRI results per Dr. Brett Fairy. Advised next step would be for her to complete EMG/NCS scheduled for 05/17/22 for further evaluation. Asked her to call back if she has any further questions.

## 2022-05-17 ENCOUNTER — Ambulatory Visit: Payer: Medicare Other | Admitting: Neurology

## 2022-05-17 ENCOUNTER — Encounter: Payer: Self-pay | Admitting: Neurology

## 2022-05-17 VITALS — BP 137/70 | HR 60 | Ht 68.0 in | Wt 226.0 lb

## 2022-05-17 DIAGNOSIS — F1510 Other stimulant abuse, uncomplicated: Secondary | ICD-10-CM

## 2022-05-17 DIAGNOSIS — M65342 Trigger finger, left ring finger: Secondary | ICD-10-CM

## 2022-05-17 DIAGNOSIS — G5602 Carpal tunnel syndrome, left upper limb: Secondary | ICD-10-CM | POA: Diagnosis not present

## 2022-05-17 DIAGNOSIS — R253 Fasciculation: Secondary | ICD-10-CM

## 2022-05-17 DIAGNOSIS — R278 Other lack of coordination: Secondary | ICD-10-CM

## 2022-05-17 DIAGNOSIS — R292 Abnormal reflex: Secondary | ICD-10-CM

## 2022-05-17 DIAGNOSIS — M47812 Spondylosis without myelopathy or radiculopathy, cervical region: Secondary | ICD-10-CM | POA: Diagnosis not present

## 2022-05-17 NOTE — Procedures (Signed)
Full Name: Novalee Horsfall Gender: Female MRN #: [id number] Date of Birth: [birth year]    Visit Date: 05/17/2022 11:12 Age: 71 Years Examining Physician: Dr. Levert Feinstein Referring Physician: Dr. Porfirio Mylar Dohmeier Height: 5 feet 8 inch History: 71 year old female with history of cervical degenerative changes, transient pain, fracture of left elbow few months ago.  Summary of the test: Nerve conduction study: Left sural, superficial peroneal sensory responses were normal.  Left median sensory response showed mildly prolonged peak latency with normal snap amplitude.  Left ulnar sensory responses were normal.  Left median, ulnar motor responses were normal.  Electromyography:  Selected needle examination of left upper extremity muscles, and bilateral cervical paraspinal muscles showed no significant abnormalities other than polyphasic motor unit potential at left mid and lower cervical paraspinals.   Conclusion: This is a slight normal study.  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  There is no evidence of active left cervical radiculopathy, but there is evidence of motor unit modulation at the left mid and lower cervical paraspinal muscles, consistent with her MRI cervical findings.    ------------------------------- Levert Feinstein, M.D. PhD  [facility] [address] [place], [place] [address] Tel: [phone] Fax: [phone]  Verbal informed consent was obtained from the patient, patient was informed of potential risk of procedure, including bruising, bleeding, hematoma formation, infection, muscle weakness, muscle pain, numbness, among others.        MNC    Nerve / Sites Muscle Latency Ref. Amplitude Ref. Rel Amp Segments Distance Velocity Ref. Area    ms ms mV mV %  cm m/s m/s mVms  L Median - APB     Wrist APB 4.3 ?4.4 5.5 ?4.0 100 Wrist - APB 7   17.8     Upper arm  APB 8.5  5.6  103 Upper arm - Wrist 22 53 ?49 20.5  L Ulnar - ADM     Wrist ADM 3.0 ?3.3 9.2 ?6.0 100 Wrist - ADM 7   31.2     B.Elbow ADM 5.5  8.1  88.4 B.Elbow - Wrist 15 60 ?49 32.5     A.Elbow ADM 7.6  7.2  88.3 A.Elbow - B.Elbow 15 71 ?49 30.7         SNC    Nerve / Sites Rec. Site Peak Lat Ref.  Amp Ref. Segments Distance    ms ms V V  cm  L Sural - Ankle (Calf)     Calf Ankle 3.9 ?4.4 4 ?6 Calf - Ankle 14  L Superficial peroneal - Ankle     Lat leg Ankle 2.8 ?4.4 6 ?6 Lat leg - Ankle 14  L Median - Orthodromic (Dig II, Mid palm)     Dig II Wrist 4.1 ?3.4 7 ?10 Dig II - Wrist 13  L Ulnar - Orthodromic, (Dig V, Mid palm)     Dig V Wrist 2.9 ?3.1 11 ?5 Dig V - Wrist 39             F  Wave    Nerve F Lat Ref.   ms ms  L Ulnar - ADM 28.2 ?32.0       EMG Summary Table    Spontaneous MUAP Recruitment  Muscle IA Fib PSW Fasc Other Amp Dur. Poly Pattern  L. Biceps brachii Normal None None None _______ Normal Normal Normal Normal  L. Deltoid Normal None  None None _______ Normal Normal Normal Normal  L. Triceps brachii Normal None None None _______ Normal Normal Normal Normal  R. Cervical paraspinals Normal None None None _______ Normal Normal Normal Normal  L. First dorsal interosseous Normal None None None _______ Normal Normal Normal Normal  L. Pronator teres Normal None None None _______ Normal Normal Normal Normal  L. Cervical paraspinals Normal None None None _______ Normal Increased 1+ Normal

## 2022-05-17 NOTE — Progress Notes (Signed)
Chief Complaint  Patient presents with   Procedure    Rm EMG/NCV 3.      ASSESSMENT AND PLAN  Brandi Potter is a 71 y.o. female   Cervical degenerative disease,  Has transient neck pain, no longer in pain,  MRI of cervical spine October 2023 showed multilevel degenerative changes, most obvious C4-5, severe left foraminal stenosis, C5-6, moderate left foraminal stenosis  EMG nerve conduction study May 17, 2022 which showed no evidence of active left cervical radiculopathy, there is evidence of motor unit remodeling at the left mid and lower cervical paraspinal muscles, consistent with MRI cervical findings.  She has mild left carpal tunnel syndromes, already wearing left wrist and elbow wrist, recovering well from her most recent left elbow fracture.  Bilateral hand tremor  Intermittent, action component, no parkinsonian features, follow-up with primary care physician make sure TSH is within normal limit  DIAGNOSTIC DATA (LABS, IMAGING, TESTING) - I reviewed patient records, labs, notes, testing and imaging myself where available.   MEDICAL HISTORY:  Brandi Potter is a 71 year old right-handed female, seen in request by Dr. Vickey Huger for electrodiagnostic study, for evaluation of possible cervical radiculopathy, abnormal MRI of cervical spine, but primary care is nurse practitioner Jerrye Bushy, [facility]  I reviewed and summarized the referring note. Depression anxiety Hyperlipidemia Hypothyroidism, History of chronic kidney disease, Obstructive sleep apnea, much improved with CPAP treatment  I personally reviewed MRI of cervical spine from October 2023, multilevel degenerative changes, most obvious at C4-5, posterior central disc protrusion with mild spinal canal stenosis severe left foraminal stenosis, C5-6, disc bulging, uncovertebral joint hypertrophy with moderate left foraminal stenosis  Patient now denies significant neck pain, no upper extremity or  lower extremity paresthesia, no weakness, no bowel or bladder incontinence.  She trip and fell twice this year.she has mild bilateral hand resting tremor, denies gait abnormality.   PHYSICAL EXAM:     Vitals:   05/17/22 1105  Weight: 226 lb (102.5 kg)  Height: 5\' 8"  (1.727 m)     Body mass index is 34.48 kg/m.  PHYSICAL EXAMNIATION:  Gen: NAD, conversant, well nourised, well groomed                     Cardiovascular: Regular rate rhythm, no peripheral edema, warm, nontender. Eyes: Conjunctivae clear without exudates or hemorrhage Neck: Supple, no carotid bruits. Pulmonary: Clear to auscultation bilaterally   NEUROLOGICAL EXAM:  MENTAL STATUS: Speech/cognition: Awake, alert, oriented to history taking and casual conversation CRANIAL NERVES: CN II: Visual fields are full to confrontation. Pupils are round equal and briskly reactive to light. CN III, IV, VI: extraocular movement are normal. No ptosis. CN V: Facial sensation is intact to light touch CN VII: Face is symmetric with normal eye closure  CN VIII: Hearing is normal to causal conversation. CN IX, X: Phonation is normal. CN XI: Head turning and shoulder shrug are intact  MOTOR: There is no pronator drift of out-stretched arms. Muscle bulk and tone are normal. Muscle strength is normal.  REFLEXES: Reflexes are 2+ and symmetric at the biceps, triceps, knees, and ankles. Plantar responses are flexor.  SENSORY: Intact to light touch, pinprick and vibratory sensation are intact in fingers and toes.  COORDINATION: There is no trunk or limb dysmetria noted.  GAIT/STANCE: Posture is normal. Gait is steady   REVIEW OF SYSTEMS:  Full 14 system review of systems performed and notable only for as above All other review of  systems were negative.   ALLERGIES: Allergies  Allergen Reactions   Aleve [Naproxen Sodium] Hives    HOME MEDICATIONS: Current Outpatient Medications  Medication Sig Dispense Refill    Calcium Citrate (CITRACAL PO) Take by mouth.     cholestyramine (QUESTRAN) 4 g packet Take 1 packet (4 g total) by mouth daily. 90 each 4   ezetimibe (ZETIA) 10 MG tablet Take 10 mg by mouth daily.     levothyroxine (SYNTHROID) 88 MCG tablet Take 75 mcg by mouth daily before breakfast.     progesterone (PROMETRIUM) 200 MG capsule Take 200 mg by mouth daily.     sertraline (ZOLOFT) 25 MG tablet Take 12.5 mg by mouth daily.      No current facility-administered medications for this visit.    PAST MEDICAL HISTORY: Past Medical History:  Diagnosis Date   Anal fissure    Hemorrhoids    High cholesterol    History of colon polyps    Hypertension    Hypothyroidism    RA (rheumatoid arthritis) (HCC)    Stage 3a chronic kidney disease (CKD) (HCC) 2022   [facility] Dr Melanee Spry    PAST SURGICAL HISTORY: Past Surgical History:  Procedure Laterality Date   COLONOSCOPY  05/08/2014   Dr Jennye Boroughs. Mild diverticular disease left colon. External hemorrhoids.   ESOPHAGOGASTRODUODENOSCOPY  05/08/2014   Dr Jennye Boroughs. Normal upper endoscopy. Empiric esophageal dilation to 13 French   HEMORRHOID SURGERY  1999   Dr Caryl Asp   HERNIA REPAIR  08/14/2019   periumbillical hernia   TONSILLECTOMY      FAMILY HISTORY: Family History  Problem Relation Age of Onset   Cancer Father        nasopharyngeal   Colon cancer Neg Hx     SOCIAL HISTORY: Social History   Socioeconomic History   Marital status: Married    Spouse name: Not on file   Number of children: Not on file   Years of education: Not on file   Highest education level: Not on file  Occupational History   Not on file  Tobacco Use   Smoking status: Former   Smokeless tobacco: Never  Vaping Use   Vaping Use: Never used  Substance and Sexual Activity   Alcohol use: Yes    Comment: ocassionally   Drug use: Not Currently   Sexual activity: Not on file  Other Topics Concern   Not on file  Social History Narrative    Not on file   Social Determinants of Health   Financial Resource Strain: Not on file  Food Insecurity: Not on file  Transportation Needs: Not on file  Physical Activity: Not on file  Stress: Not on file  Social Connections: Not on file  Intimate Partner Violence: Not on file      Levert Feinstein, M.D. Ph.D.  [facility] [address], [address] [place], [place] [address] Ph: [phone] Fax: [phone]  CC:  Jerrye Bushy, FNP [address] [place],  [place] [address]  Jerrye Bushy, FNP

## 2022-05-18 ENCOUNTER — Telehealth: Payer: Self-pay | Admitting: *Deleted

## 2022-05-18 NOTE — Telephone Encounter (Signed)
-----   Message from Huston Foley, MD sent at 05/17/2022  5:21 PM EST ----- EMG and nerve conduction velocity test which is the electrical nerve and muscle test through our office does not show any obvious active pinched nerve type findings from the neck.  She has evidence of mild carpal tunnel on the left, if she has symptoms such as numbness and tingling in the hand in particular at night, she can start using an over-the-counter wrist splint, particularly at night when she sleeps.

## 2022-06-15 ENCOUNTER — Encounter: Payer: Self-pay | Admitting: Adult Health

## 2022-06-15 ENCOUNTER — Ambulatory Visit: Payer: Medicare Other | Admitting: Adult Health

## 2022-06-15 VITALS — BP 141/65 | HR 64 | Ht 67.0 in | Wt 229.2 lb

## 2022-06-15 DIAGNOSIS — R253 Fasciculation: Secondary | ICD-10-CM

## 2022-06-15 NOTE — Patient Instructions (Addendum)
We will check lab work today to look for reversible causes of twitching - you will be called with any abnormal findings   Follow up with Dr. Vickey Huger if symptoms should worsen or do not further improve       Neck Exercises Ask your health care provider which exercises are safe for you. Do exercises exactly as told by your health care provider and adjust them as directed. It is normal to feel mild stretching, pulling, tightness, or discomfort as you do these exercises. Stop right away if you feel sudden pain or your pain gets worse. Do not begin these exercises until told by your health care provider. Neck exercises can be important for many reasons. They can improve strength and maintain flexibility in your neck, which will help your upper back and prevent neck pain. Stretching exercises Rotation neck stretching  Sit in a chair or stand up. Place your feet flat on the floor, shoulder-width apart. Slowly turn your head (rotate) to the right until a slight stretch is felt. Turn it all the way to the right so you can look over your right shoulder. Do not tilt or tip your head. Hold this position for 10-30 seconds. Slowly turn your head (rotate) to the left until a slight stretch is felt. Turn it all the way to the left so you can look over your left shoulder. Do not tilt or tip your head. Hold this position for 10-30 seconds.  Neck retraction  Sit in a sturdy chair or stand up. Look straight ahead. Do not bend your neck. Use your fingers to push your chin backward (retraction). Do not bend your neck for this movement. Continue to face straight ahead. If you are doing the exercise properly, you will feel a slight sensation in your throat and a stretch at the back of your neck. Hold the stretch for 1-2 seconds.  Strengthening exercises Neck press  Lie on your back on a firm bed or on the floor with a pillow under your head. Use your neck muscles to push your head down on the pillow and  straighten your spine. Hold the position as well as you can. Keep your head facing up (in a neutral position) and your chin tucked. Slowly count to 5 while holding this position.  Isometrics These are exercises in which you strengthen the muscles in your neck while keeping your neck still (isometrics). Sit in a supportive chair and place your hand on your forehead. Keep your head and face facing straight ahead. Do not flex or extend your neck while doing isometrics. Push forward with your head and neck while pushing back with your hand. Hold for 10 seconds. Do the sequence again, this time putting your hand against the back of your head. Use your head and neck to push backward against the hand pressure. Finally, do the same exercise on either side of your head, pushing sideways against the pressure of your hand.  Prone head lifts  Lie face-down (prone position), resting on your elbows so that your chest and upper back are raised. Start with your head facing downward, near your chest. Position your chin either on or near your chest. Slowly lift your head upward. Lift until you are looking straight ahead. Then continue lifting your head as far back as you can comfortably stretch. Hold your head up for 5 seconds. Then slowly lower it to your starting position.  Supine head lifts  Lie on your back (supine position), bending your knees to  point to the ceiling and keeping your feet flat on the floor. Lift your head slowly off the floor, raising your chin toward your chest. Hold for 5 seconds.  Scapular retraction  Stand with your arms at your sides. Look straight ahead. Slowly pull both shoulders (scapulae) backward and downward (retraction) until you feel a stretch between your shoulder blades in your upper back. Hold for 10-30 seconds. Relax and repeat.  Contact a health care provider if: Your neck pain or discomfort gets worse when you do an exercise. Your neck pain or discomfort does  not improve within 2 hours after you exercise. If you have any of these problems, stop exercising right away. Do not do the exercises again unless your health care provider says that you can. Get help right away if: You develop sudden, severe neck pain. If this happens, stop exercising right away. Do not do the exercises again unless your health care provider says that you can. This information is not intended to replace advice given to you by your health care provider. Make sure you discuss any questions you have with your health care provider. Document Revised: 11/30/2020 Document Reviewed: 11/30/2020 Elsevier Patient Education  [place].

## 2022-06-15 NOTE — Progress Notes (Signed)
[facility] [address] [place]. [place] [address] [phone]       OFFICE FOLLOW UP NOTE  Ms. Brandi Potter Date of Birth:  28-Oct-1950 Medical Record Number:  [id number]   Reason for visit:     SUBJECTIVE:   CHIEF COMPLAINT:  Chief Complaint  Patient presents with   Altered Mental Status   Room 2    Pt is here Alone. Pt states that everything is going pretty good. Pt states that she had some twitching recently.    Follow-up visit  Prior visit: 03/02/2022 with Dr. Vickey Huger   Brief HPI:   Brandi Potter is a 71 y.o. female who was seen by Dr. Vickey Huger 3 months ago for twitching and jerking movements and previously seen by Shawnie Dapper, NP for OSA on CPAP.   At prior visit, concern of dysmetria and question motor neuron impairment.  MRI cervical did not show evidence of myelopathy or canal stenosis that would explain brisk reflexes and muscle twitching, noted left shoulder, neck and upper arm may be affected by a pinched nerve but there is no explanation for overall hyperreflexia.  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    Interval history:  Reports twitching has improved since prior visit, does not seem to be as strong nor as frequent.  Does report improvement of home stressors, she questions if this is related.  She does have mild cervicalgia without radiculopathy, asks about neck stretching exercises.  Does report nightly use of CPAP but has been having recurrent sinus infections, this has since improved, she has changed the way she is cleaning CPAP and has been using nightly.        ROS:   14 system review of systems performed and negative with exception of those listed in HPI  PMH:  Past Medical History:  Diagnosis Date   Anal fissure    Hemorrhoids    High cholesterol    History of colon polyps    Hypertension    Hypothyroidism     RA (rheumatoid arthritis) (HCC)    Stage 3a chronic kidney disease (CKD) (HCC) 2022   [facility] Dr Melanee Spry    PSH:  Past Surgical History:  Procedure Laterality Date   COLONOSCOPY  05/08/2014   Dr Jennye Boroughs. Mild diverticular disease left colon. External hemorrhoids.   ESOPHAGOGASTRODUODENOSCOPY  05/08/2014   Dr Jennye Boroughs. Normal upper endoscopy. Empiric esophageal dilation to 30 French   HEMORRHOID SURGERY  1999   Dr Caryl Asp   HERNIA REPAIR  08/14/2019   periumbillical hernia   TONSILLECTOMY      Social History:  Social History   Socioeconomic History   Marital status: Married    Spouse name: Not on file   Number of children: Not on file   Years of education: Not on file   Highest education level: Not on file  Occupational History   Not on file  Tobacco Use   Smoking status: Former   Smokeless tobacco: Never  Vaping Use   Vaping Use: Never used  Substance and Sexual Activity   Alcohol use: Yes    Comment: ocassionally   Drug use: Not Currently   Sexual activity: Not on file  Other Topics Concern   Not on file  Social History Narrative   Not on file   Social Determinants of Health   Financial Resource Strain: Not on file  Food Insecurity: Not  on file  Transportation Needs: Not on file  Physical Activity: Not on file  Stress: Not on file  Social Connections: Not on file  Intimate Partner Violence: Not on file    Family History:  Family History  Problem Relation Age of Onset   Cancer Father        nasopharyngeal   Colon cancer Neg Hx     Medications:   Current Outpatient Medications on File Prior to Visit  Medication Sig Dispense Refill   Calcium Citrate (CITRACAL PO) Take by mouth.     cholestyramine (QUESTRAN) 4 g packet Take 1 packet (4 g total) by mouth daily. 90 each 4   ezetimibe (ZETIA) 10 MG tablet Take 10 mg by mouth daily.     levothyroxine (SYNTHROID) 88 MCG tablet Take 75 mcg by mouth daily before breakfast.      predniSONE (DELTASONE) 20 MG tablet Take 20 mg by mouth daily with breakfast.     progesterone (PROMETRIUM) 200 MG capsule Take 200 mg by mouth daily.     sertraline (ZOLOFT) 25 MG tablet Take by mouth daily.     sulfamethoxazole-trimethoprim (BACTRIM) 400-80 MG tablet Take 1 tablet by mouth 2 (two) times daily.     No current facility-administered medications on file prior to visit.    Allergies:   Allergies  Allergen Reactions   Aleve [Naproxen Sodium] Hives      OBJECTIVE:  Physical Exam  Vitals:   06/15/22 1428  BP: (!) 141/65  Pulse: 64  Weight: 229 lb 3.2 oz (104 kg)  Height: 5\' 7"  (1.702 m)   Body mass index is 35.9 kg/m. No results found.  General: well developed, well nourished, very pleasant elderly Caucasian female, seated, in no evident distress Head: head normocephalic and atraumatic.   Neck: supple with no carotid or supraclavicular bruits Cardiovascular: regular rate and rhythm, no murmurs Musculoskeletal: no deformity Skin:  no rash/petichiae Vascular:  Normal pulses all extremities   Neurologic Exam Mental Status: Awake and fully alert. Oriented to place and time. Recent and remote memory intact. Attention span, concentration and fund of knowledge appropriate. Mood and affect appropriate.  Cranial Nerves: Pupils equal, briskly reactive to light. Extraocular movements full without nystagmus. Visual fields full to confrontation. Hearing intact. Facial sensation intact. Face, tongue, palate moves normally and symmetrically.  Motor: Normal bulk and tone. Normal strength in all tested extremity muscles Sensory.: intact to touch , pinprick , position and vibratory sensation.  Coordination: Rapid alternating movements normal in all extremities. Finger-to-nose and heel-to-shin performed accurately bilaterally. Gait and Station: Arises from chair without difficulty. Stance is normal. Gait demonstrates normal stride length and balance without use of AD. Tandem walk  and heel toe without difficulty.  Reflexes: 1+ and symmetric. Toes downgoing.         ASSESSMENT/PLAN: Brandi Potter is a 71 y.o. year old female followed by Dr. 62 for generalized hyperreflexia and muscle twitching.  EMG/NCV and cervical imaging unremarkable for underlying conditions contributing to symptoms.  Reports improvement of twitching since prior visit without intervention.  Possibly related to improvement of pulm stressors.  Will check lab work today to rule out reversible causes.  Provided neck stretching exercises on AVS, declines interest in PT at this time.   Advised to call if symptoms should worsen or remains persistent without further improvement for further evaluation with Dr. Vickey Huger     CC:  PCP: Vickey Huger, FNP    I spent 24 minutes of face-to-face and  non-face-to-face time with patient.  This included previsit chart review, lab review, study review, order entry, electronic health record documentation, patient education regarding the above and answered all the questions to patient's satisfaction   Ihor Austin, [facility]  [facility] [address] [place], [place] [address]  Phone [phone] Fax [phone] Note: This document was prepared with digital dictation and possible smart phrase technology. Any transcriptional errors that result from this process are unintentional.

## 2022-06-18 LAB — COMPREHENSIVE METABOLIC PANEL
ALT: 20 IU/L (ref 0–32)
AST: 19 IU/L (ref 0–40)
Albumin/Globulin Ratio: 2.3 — ABNORMAL HIGH (ref 1.2–2.2)
Albumin: 4.3 g/dL (ref 3.8–4.8)
Alkaline Phosphatase: 75 IU/L (ref 44–121)
BUN/Creatinine Ratio: 13 (ref 12–28)
BUN: 19 mg/dL (ref 8–27)
Bilirubin Total: 0.3 mg/dL (ref 0.0–1.2)
CO2: 21 mmol/L (ref 20–29)
Calcium: 9.5 mg/dL (ref 8.7–10.3)
Chloride: 105 mmol/L (ref 96–106)
Creatinine, Ser: 1.44 mg/dL — ABNORMAL HIGH (ref 0.57–1.00)
Globulin, Total: 1.9 g/dL (ref 1.5–4.5)
Glucose: 85 mg/dL (ref 70–99)
Potassium: 5.1 mmol/L (ref 3.5–5.2)
Sodium: 141 mmol/L (ref 134–144)
Total Protein: 6.2 g/dL (ref 6.0–8.5)
eGFR: 39 mL/min/{1.73_m2} — ABNORMAL LOW (ref >59)

## 2022-06-18 LAB — VITAMIN E
Vitamin E (Alpha Tocopherol): 13.3 mg/L (ref 9.0–29.0)
Vitamin E(Gamma Tocopherol): 1.8 mg/L (ref 0.5–4.9)

## 2022-06-18 LAB — MAGNESIUM: Magnesium: 2.2 mg/dL (ref 1.6–2.3)

## 2022-06-18 LAB — THYROID PANEL WITH TSH
Free Thyroxine Index: 2.4 (ref 1.2–4.9)
T3 Uptake Ratio: 27 % (ref 24–39)
T4, Total: 9 ug/dL (ref 4.5–12.0)
TSH: 3.68 u[IU]/mL (ref 0.450–4.500)

## 2022-06-29 ENCOUNTER — Other Ambulatory Visit (HOSPITAL_COMMUNITY): Payer: Self-pay

## 2022-06-30 ENCOUNTER — Other Ambulatory Visit (HOSPITAL_COMMUNITY): Payer: Self-pay

## 2022-06-30 MED ORDER — LEVOTHYROXINE SODIUM 75 MCG PO TABS
75.0000 ug | ORAL_TABLET | Freq: Every morning | ORAL | 0 refills | Status: DC
  Filled 2022-06-30: qty 90, 90d supply, fill #0

## 2022-07-05 ENCOUNTER — Other Ambulatory Visit (HOSPITAL_COMMUNITY): Payer: Self-pay

## 2022-07-06 ENCOUNTER — Other Ambulatory Visit (HOSPITAL_COMMUNITY): Payer: Self-pay

## 2022-07-07 ENCOUNTER — Other Ambulatory Visit (HOSPITAL_COMMUNITY): Payer: Self-pay

## 2022-08-04 ENCOUNTER — Other Ambulatory Visit: Payer: Self-pay | Admitting: Gastroenterology

## 2022-09-07 ENCOUNTER — Other Ambulatory Visit (HOSPITAL_COMMUNITY): Payer: Self-pay

## 2022-09-08 ENCOUNTER — Other Ambulatory Visit (HOSPITAL_COMMUNITY): Payer: Self-pay

## 2022-09-12 ENCOUNTER — Other Ambulatory Visit (HOSPITAL_COMMUNITY): Payer: Self-pay

## 2022-09-12 ENCOUNTER — Other Ambulatory Visit: Payer: Self-pay

## 2022-09-12 MED ORDER — LEVOTHYROXINE SODIUM 88 MCG PO TABS
88.0000 ug | ORAL_TABLET | Freq: Every day | ORAL | 4 refills | Status: DC
  Filled 2022-09-12: qty 90, 90d supply, fill #0
  Filled 2022-12-09: qty 90, 90d supply, fill #1
  Filled 2023-02-26: qty 90, 90d supply, fill #2
  Filled 2023-05-23: qty 90, 90d supply, fill #3
  Filled 2023-08-21: qty 90, 90d supply, fill #4

## 2022-09-13 ENCOUNTER — Other Ambulatory Visit: Payer: Self-pay

## 2022-09-13 ENCOUNTER — Other Ambulatory Visit (HOSPITAL_COMMUNITY): Payer: Self-pay

## 2022-09-13 MED ORDER — PROGESTERONE 200 MG PO CAPS
200.0000 mg | ORAL_CAPSULE | Freq: Every day | ORAL | 3 refills | Status: DC
  Filled 2022-09-13: qty 90, 90d supply, fill #0
  Filled 2022-12-09: qty 90, 90d supply, fill #1
  Filled 2023-03-06: qty 80, 80d supply, fill #2

## 2022-09-13 MED ORDER — EZETIMIBE 10 MG PO TABS
10.0000 mg | ORAL_TABLET | Freq: Every day | ORAL | 0 refills | Status: DC
  Filled 2022-09-13: qty 100, 100d supply, fill #0

## 2022-09-13 MED ORDER — SERTRALINE HCL 25 MG PO TABS
25.0000 mg | ORAL_TABLET | Freq: Every day | ORAL | 0 refills | Status: DC
  Filled 2022-09-13: qty 90, 90d supply, fill #0

## 2022-12-09 ENCOUNTER — Other Ambulatory Visit (HOSPITAL_COMMUNITY): Payer: Self-pay

## 2022-12-11 ENCOUNTER — Other Ambulatory Visit: Payer: Self-pay

## 2022-12-13 ENCOUNTER — Other Ambulatory Visit (HOSPITAL_COMMUNITY): Payer: Self-pay

## 2022-12-13 ENCOUNTER — Other Ambulatory Visit: Payer: Self-pay

## 2022-12-13 MED ORDER — COLESTIPOL HCL 1 G PO TABS
5.0000 g | ORAL_TABLET | Freq: Every day | ORAL | 4 refills | Status: DC
  Filled 2022-12-13: qty 450, 90d supply, fill #0
  Filled 2023-03-09: qty 450, 90d supply, fill #1
  Filled 2023-06-03: qty 450, 90d supply, fill #2
  Filled 2023-08-26: qty 450, 90d supply, fill #3

## 2022-12-14 ENCOUNTER — Other Ambulatory Visit (HOSPITAL_COMMUNITY): Payer: Self-pay

## 2023-01-17 ENCOUNTER — Encounter: Payer: Self-pay | Admitting: Neurology

## 2023-01-17 ENCOUNTER — Ambulatory Visit: Payer: PPO | Admitting: Neurology

## 2023-01-17 VITALS — BP 143/77 | HR 64 | Ht 67.0 in | Wt 224.0 lb

## 2023-01-17 DIAGNOSIS — R253 Fasciculation: Secondary | ICD-10-CM | POA: Diagnosis not present

## 2023-01-17 DIAGNOSIS — G4733 Obstructive sleep apnea (adult) (pediatric): Secondary | ICD-10-CM

## 2023-01-17 NOTE — Patient Instructions (Signed)

## 2023-01-17 NOTE — Progress Notes (Signed)
Provider:  Melvyn Novas, MD  Primary Care Physician:  Jerrye Bushy, FNP [address] [place] [address]     Referring Provider: Maryjean Ka, MD [facility].         Chief Complaint according to patient   Patient presents with:     New Patient (Initial Visit)     Pt states that she has been doing well with her Luna CPAP       HISTORY OF PRESENT ILLNESS:  Brandi Potter is a 72 y.o. Caucasian female patient who is here for revisit 01/17/2023 for RV on CPAP .  Chief concern according to patient :  changed PCP to Dr Casper Harrison, MD.  Last visit with this patient took placeThe March 02, 2022 through me when she presented with generalized hyperreflexia and I had asked my colleague Dr. Terrace Arabia to see her for a complaint of muscle twitching and hyperreflexia. The March 02, 2022 through me when she presented with generalized hyperreflexia and I had asked my colleague Dr. Terrace Arabia to see her for a complaint of muscle twitching and hyperreflexia.  Her twitching is much less now, she is less worried about.  My colleague did a nerve conduction study and she said it was a slight normal and she meant abnormal study.  There is evidence of left median neuropathy across the wrist this is carpal tunnel -  She had actually previous fracture at the right wrist.  Dr. Melvyn Novas treated her  when she had a right wrist fracture, and 6 months later she had a left elbow fracture " .  There was no evidence of active left cervical radiculopathy but some motor unit modulation in the left mid and lower cervical paraspinal muscles and this is consistent with her MRI cervical spine findings.  So cervical degenerative disc disease and very mild carpal tunnel symptoms.  She does not have any bothering from it.      OSA : In addition, she is my CPAP patient and I see her for obstructive sleep apnea she uses an AutoPap and this is set, ramp of 4 cm water through a minimum pressure of 6 and a maximum  of 16 with 2 cm expiratory pressure relief.  The patient has been 100% compliant by days 85% compliant by hours and she only falls sometimes short of the 4-hour mark.   The average apnea hypopnea index is 0.3/h so she has excellent control of her apnea the 95th percentile pressure is 7.5 cm water which is low and she does not have a high air leak either.   Her mask mask fit very well as air does not escape the air leak is only 7 L.    She uses a small under the nose cradle the N 30I by [employer]. She tried a size up and didn't work well for her, triad another mask  ( nasal cradle )and headgear was not comfortable.            [facility] SLEEP TEST ( by Watch PAT) REPORT   STUDY DATE: 11-22-20/ data 11-25-2020   DOB: 03-26-1951   MRN: [id number]   ORDERING CLINICIAN: Melvyn Novas, MD   REFERRING CLINICIAN: PCP was :"[facility]" , [place], NP    CLINICAL INFORMATION/HISTORY: Brandi Potter  has a past medical history of  Hemorrhoids, High cholesterol, Hernia, leg pain/History of colon polyps, Hypertension, CKD 3 (  no more celebrex) ,  Hypothyroidism ( on synthroid) , and RA (rheumatoid arthritis) (HCC). Less hypertension since her retirement. She wakes up refreshed but is a loud snorer. She takes sometimes 3-4 naps, her sleep routines have deteriorated since retirement.    Epworth sleepiness score: 8/24. FSS at 22/63 points   AHI 19.9 / h and NREM dominant apnea.              Review of Systems: Out of a complete 14 system review, the patient complains of only the following symptoms, and all other reviewed systems are negative.:  Fatigue, sleepiness , snoring is controlled, she gets 7-8 hours of sleep . ITCHANGED MY LIFE -  But I recently nap again.  I started exercising and take a nap after that.  I have a bit of air leak into my eyes.   Some tingling under my scalp, in daytime - may be irritation by headgear?  Feels like its deeper in my  skull.    How likely are you to doze in the following situations: 0 = not likely, 1 = slight chance, 2 = moderate chance, 3 = high chance   Sitting and Reading? Watching Television? Sitting inactive in a public place (theater or meeting)? As a passenger in a car for an hour without a break? Lying down in the afternoon when circumstances permit? Sitting and talking to someone? Sitting quietly after lunch without alcohol? In a car, while stopped for a few minutes in traffic?   Total = 7/ 24 points   FSS endorsed at 30 from 23/ 63 points.   GDS 5/ 15 points - borderline, anxiety . Depression.   Social History   Socioeconomic History   Marital status: Single    Spouse name: Not on file   Number of children: Not on file   Years of education: Not on file   Highest education level: Not on file  Occupational History   Not on file  Tobacco Use   Smoking status: Former   Smokeless tobacco: Never  Vaping Use   Vaping status: Never Used  Substance and Sexual Activity   Alcohol use: Yes    Comment: ocassionally   Drug use: Not Currently   Sexual activity: Not on file  Other Topics Concern   Not on file  Social History Narrative   Not on file   Social Determinants of Health   Financial Resource Strain: Not on file  Food Insecurity: Not on file  Transportation Needs: Not on file  Physical Activity: Not on file  Stress: Not on file  Social Connections: Not on file    Family History  Problem Relation Age of Onset   Cancer Father        nasopharyngeal   Colon cancer Neg Hx     Past Medical History:  Diagnosis Date   Anal fissure    Hemorrhoids    High cholesterol    History of colon polyps    Hypertension    Hypothyroidism    RA (rheumatoid arthritis) (HCC)    Stage 3a chronic kidney disease (CKD) (HCC) 2022   [facility] Dr Melanee Spry    Past Surgical History:  Procedure Laterality Date   COLONOSCOPY  05/08/2014   Dr Jennye Boroughs. Mild diverticular  disease left colon. External hemorrhoids.   ESOPHAGOGASTRODUODENOSCOPY  05/08/2014   Dr Jennye Boroughs. Normal upper endoscopy. Empiric esophageal dilation to 51 French   HEMORRHOID SURGERY  1999   Dr Caryl Asp   HERNIA REPAIR  08/14/2019   periumbillical hernia   TONSILLECTOMY       Current Outpatient Medications on File Prior to Visit  Medication Sig Dispense Refill   colestipol (COLESTID) 1 g tablet Take 5 tablets (5 g total) by mouth daily. 450 tablet 4   levothyroxine (SYNTHROID) 88 MCG tablet Take 1 tablet (88 mcg total) by mouth daily. 90 tablet 4   progesterone (PROMETRIUM) 200 MG capsule Take 1 capsule (200 mg total) by mouth daily. 90 capsule 3   sertraline (ZOLOFT) 25 MG tablet Take 1 tablet (25 mg total) by mouth daily. 90 tablet 0   TURMERIC PO Take 2,400 mg by mouth in the morning and at bedtime.     No current facility-administered medications on file prior to visit.    Allergies  Allergen Reactions   Aleve [Naproxen Sodium] Hives     DIAGNOSTIC DATA (LABS, IMAGING, TESTING) - I reviewed patient records, labs, notes, testing and imaging myself where available.  No results found for: "WBC", "HGB", "HCT", "MCV", "PLT"    Component Value Date/Time   NA 141 06/15/2022 1447   K 5.1 06/15/2022 1447   CL 105 06/15/2022 1447   CO2 21 06/15/2022 1447   GLUCOSE 85 06/15/2022 1447   BUN 19 06/15/2022 1447   CREATININE 1.44 (H) 06/15/2022 1447   CALCIUM 9.5 06/15/2022 1447   PROT 6.2 06/15/2022 1447   ALBUMIN 4.3 06/15/2022 1447   AST 19 06/15/2022 1447   ALT 20 06/15/2022 1447   ALKPHOS 75 06/15/2022 1447   BILITOT 0.3 06/15/2022 1447   No results found for: "CHOL", "HDL", "LDLCALC", "LDLDIRECT", "TRIG", "CHOLHDL" No results found for: "HGBA1C" No results found for: "VITAMINB12" Lab Results  Component Value Date   TSH 3.680 06/15/2022    PHYSICAL EXAM:  Today's Vitals   01/17/23 0929  BP: (!) 143/77  Pulse: 64  Weight: 224 lb (101.6 kg)  Height: 5\' 7"   (1.702 m)   Body mass index is 35.08 kg/m.   Wt Readings from Last 3 Encounters:  01/17/23 224 lb (101.6 kg)  06/15/22 229 lb 3.2 oz (104 kg)  05/17/22 226 lb (102.5 kg)     Ht Readings from Last 3 Encounters:  01/17/23 5\' 7"  (1.702 m)  06/15/22 5\' 7"  (1.702 m)  05/17/22 5\' 8"  (1.727 m)      General: The patient is awake, alert and appears not in acute distress. The patient is well groomed. Head: Normocephalic, atraumatic. Neck is supple. Mallampati 2,  neck circumference:14 inches . Nasal airflow  patent.  Retrognathia is not seen- small jaws and small mouth. .  Dental status: biological  Cardiovascular:  Regular rate and cardiac rhythm by pulse,  without distended neck veins. Respiratory: Lungs are clear to auscultation.  Skin:  Without evidence of ankle edema, or rash. Trunk: The patient's posture is erect.   Neurologic exam : The patient is awake and alert, oriented to place and time.   Memory subjective described as intact.  Attention span & concentration ability appears normal.  Speech is fluent,  without  dysarthria, dysphonia or aphasia.  Mood and affect are appropriate.   Cranial nerves: no loss of smell or taste reported  Pupils are equal and briskly reactive to light. Funduscopic exam deferred. .  Extraocular movements in vertical and horizontal planes were intact and without nystagmus. No Diplopia. Visual fields by finger perimetry are intact. Hearing was intact to soft voice and finger rubbing.   Facial sensation intact to fine touch. Facial motor  strength is symmetric and tongue and uvula move midline.  Neck ROM : rotation, tilt and flexion extension were normal for age and shoulder shrug was symmetrical.    Motor exam:  Symmetric bulk, tone and somewhat restricted neck ROM.    Normal tone without cog- wheeling, reduced grip strength . Left hand thenar eminence is atrophied, weaker grip than right.   Sensory:  Fine touch, pinprick and vibration were tested   and  normal.  Proprioception tested in the upper extremities was normal.   Coordination: Rapid alternating movements in the fingers/hands were of normal speed.  The Finger-to-nose maneuver was intact without evidence of ataxia, dysmetria or tremor.  ASSESSMENT AND PLAN 27- year- old  caucasian female OSA patient  here with:    1) compliant CPAP use but reports she has been taken more naps recently- I attribute this more to depression / anxiety ( about her son's health - she is worried about his stem cell transplant after care) and she started an exercise program.   2) very good response to PAP and numerically no worries about compliance, air leak residual AHI.   3) consider cognitive behavior therapy for anxiety.    I plan to follow up either personally or through our NP within 12 months.   I would like to thank Jerrye Bushy, FNP and Jerrye Bushy, Fnp [address] [place],  [place] [address] for allowing me to meet with and to take care of this pleasant patient.     After spending a total time of  25  minutes face to face and additional time for physical and neurologic examination, review of laboratory studies,  personal review of imaging studies, reports and results of other testing and review of referral information / records as far as provided in visit,   Electronically signed by: Melvyn Novas, MD 01/17/2023 10:08 AM  [facility] Board certified by The [employer] of Sleep Medicine and Diplomate of the [employer] of Sleep Medicine. Board certified In Neurology through the ABPN, Fellow of the [employer] of Neu[employer].

## 2023-01-28 ENCOUNTER — Encounter: Payer: Self-pay | Admitting: Adult Health

## 2023-02-12 ENCOUNTER — Ambulatory Visit: Payer: Medicare Other | Admitting: Neurology

## 2023-02-26 ENCOUNTER — Other Ambulatory Visit (HOSPITAL_COMMUNITY): Payer: Self-pay

## 2023-02-26 MED ORDER — SERTRALINE HCL 50 MG PO TABS
50.0000 mg | ORAL_TABLET | Freq: Every day | ORAL | 3 refills | Status: AC
  Filled 2023-02-26: qty 90, 90d supply, fill #0
  Filled 2023-05-23: qty 90, 90d supply, fill #1

## 2023-02-27 ENCOUNTER — Other Ambulatory Visit: Payer: Self-pay

## 2023-03-07 ENCOUNTER — Other Ambulatory Visit (HOSPITAL_COMMUNITY): Payer: Self-pay

## 2023-03-09 ENCOUNTER — Other Ambulatory Visit (HOSPITAL_COMMUNITY): Payer: Self-pay

## 2023-03-16 ENCOUNTER — Other Ambulatory Visit (HOSPITAL_COMMUNITY): Payer: Self-pay

## 2023-04-30 ENCOUNTER — Other Ambulatory Visit (HOSPITAL_COMMUNITY): Payer: Self-pay

## 2023-04-30 MED ORDER — PROGESTERONE 200 MG PO CAPS
200.0000 mg | ORAL_CAPSULE | Freq: Every day | ORAL | 3 refills | Status: AC
  Filled 2023-04-30 – 2023-05-23 (×2): qty 90, 90d supply, fill #0
  Filled 2023-08-26: qty 90, 90d supply, fill #1
  Filled 2023-11-13: qty 90, 90d supply, fill #2
  Filled 2024-02-20: qty 90, 90d supply, fill #3

## 2023-05-02 ENCOUNTER — Encounter (HOSPITAL_COMMUNITY): Payer: Self-pay

## 2023-05-03 ENCOUNTER — Other Ambulatory Visit (HOSPITAL_BASED_OUTPATIENT_CLINIC_OR_DEPARTMENT_OTHER): Payer: Self-pay

## 2023-05-10 ENCOUNTER — Other Ambulatory Visit (HOSPITAL_COMMUNITY): Payer: Self-pay

## 2023-05-10 MED ORDER — ATORVASTATIN CALCIUM 10 MG PO TABS
10.0000 mg | ORAL_TABLET | Freq: Every evening | ORAL | 4 refills | Status: DC
  Filled 2023-05-10 – 2023-08-20 (×4): qty 90, 90d supply, fill #0
  Filled 2023-11-13 – 2024-02-11 (×3): qty 90, 90d supply, fill #1

## 2023-05-10 MED ORDER — LOSARTAN POTASSIUM 50 MG PO TABS
50.0000 mg | ORAL_TABLET | Freq: Every day | ORAL | 4 refills | Status: DC
  Filled 2023-05-10 – 2023-06-03 (×3): qty 90, 90d supply, fill #0
  Filled 2023-11-13: qty 90, 90d supply, fill #1

## 2023-05-10 MED ORDER — VITAMIN B-12 1000 MCG PO TABS
2000.0000 ug | ORAL_TABLET | Freq: Every day | ORAL | 4 refills | Status: AC
  Filled 2023-05-10 – 2023-05-23 (×2): qty 90, 45d supply, fill #0
  Filled 2023-07-07: qty 90, 45d supply, fill #1
  Filled 2023-08-20 – 2023-08-21 (×2): qty 90, 45d supply, fill #2
  Filled 2023-10-17 – 2023-10-25 (×4): qty 90, 45d supply, fill #3
  Filled 2024-05-07: qty 90, 45d supply, fill #4

## 2023-05-23 ENCOUNTER — Other Ambulatory Visit (HOSPITAL_COMMUNITY): Payer: Self-pay

## 2023-05-24 ENCOUNTER — Other Ambulatory Visit (HOSPITAL_COMMUNITY): Payer: Self-pay

## 2023-05-24 ENCOUNTER — Other Ambulatory Visit: Payer: Self-pay

## 2023-06-04 ENCOUNTER — Other Ambulatory Visit: Payer: Self-pay

## 2023-06-04 ENCOUNTER — Other Ambulatory Visit (HOSPITAL_COMMUNITY): Payer: Self-pay

## 2023-06-07 ENCOUNTER — Other Ambulatory Visit (HOSPITAL_COMMUNITY): Payer: Self-pay

## 2023-06-22 DIAGNOSIS — R2689 Other abnormalities of gait and mobility: Secondary | ICD-10-CM | POA: Diagnosis not present

## 2023-06-22 DIAGNOSIS — M6281 Muscle weakness (generalized): Secondary | ICD-10-CM | POA: Diagnosis not present

## 2023-06-22 DIAGNOSIS — M79652 Pain in left thigh: Secondary | ICD-10-CM | POA: Diagnosis not present

## 2023-06-25 ENCOUNTER — Telehealth: Payer: Self-pay | Admitting: Neurology

## 2023-06-25 NOTE — Telephone Encounter (Signed)
 Pt asking for appt due to a diagnosis of Meningioma by neurologist at [facility]. Advise to see neurologist. Informed pt to have them to  send over a referral for that diagnosis. Pt verbalize understand will call CNS.

## 2023-06-26 NOTE — Telephone Encounter (Signed)
 Pt LVM at 4:07 pm returning phone. Contacted patient to inform  would need a referral for that diagnosis. Patient verbalized understand.

## 2023-06-26 NOTE — Telephone Encounter (Signed)
 Noted. Since we have not seen pt for this, a referral would be needed.

## 2023-06-27 DIAGNOSIS — R2689 Other abnormalities of gait and mobility: Secondary | ICD-10-CM | POA: Diagnosis not present

## 2023-06-27 DIAGNOSIS — M79652 Pain in left thigh: Secondary | ICD-10-CM | POA: Diagnosis not present

## 2023-06-27 DIAGNOSIS — M6281 Muscle weakness (generalized): Secondary | ICD-10-CM | POA: Diagnosis not present

## 2023-07-04 DIAGNOSIS — M6281 Muscle weakness (generalized): Secondary | ICD-10-CM | POA: Diagnosis not present

## 2023-07-04 DIAGNOSIS — R2689 Other abnormalities of gait and mobility: Secondary | ICD-10-CM | POA: Diagnosis not present

## 2023-07-04 DIAGNOSIS — M79652 Pain in left thigh: Secondary | ICD-10-CM | POA: Diagnosis not present

## 2023-07-05 DIAGNOSIS — Z6833 Body mass index (BMI) 33.0-33.9, adult: Secondary | ICD-10-CM | POA: Diagnosis not present

## 2023-07-05 DIAGNOSIS — S76312D Strain of muscle, fascia and tendon of the posterior muscle group at thigh level, left thigh, subsequent encounter: Secondary | ICD-10-CM | POA: Diagnosis not present

## 2023-07-09 ENCOUNTER — Other Ambulatory Visit: Payer: Self-pay

## 2023-07-09 ENCOUNTER — Other Ambulatory Visit (HOSPITAL_COMMUNITY): Payer: Self-pay

## 2023-07-12 ENCOUNTER — Other Ambulatory Visit (HOSPITAL_COMMUNITY): Payer: Self-pay

## 2023-07-12 ENCOUNTER — Other Ambulatory Visit: Payer: Self-pay

## 2023-07-12 DIAGNOSIS — I672 Cerebral atherosclerosis: Secondary | ICD-10-CM | POA: Diagnosis not present

## 2023-07-12 DIAGNOSIS — E538 Deficiency of other specified B group vitamins: Secondary | ICD-10-CM | POA: Diagnosis not present

## 2023-07-12 DIAGNOSIS — D320 Benign neoplasm of cerebral meninges: Secondary | ICD-10-CM | POA: Diagnosis not present

## 2023-07-12 DIAGNOSIS — K9089 Other intestinal malabsorption: Secondary | ICD-10-CM | POA: Diagnosis not present

## 2023-07-12 DIAGNOSIS — R0989 Other specified symptoms and signs involving the circulatory and respiratory systems: Secondary | ICD-10-CM | POA: Diagnosis not present

## 2023-07-12 DIAGNOSIS — E785 Hyperlipidemia, unspecified: Secondary | ICD-10-CM | POA: Diagnosis not present

## 2023-07-12 DIAGNOSIS — N1832 Chronic kidney disease, stage 3b: Secondary | ICD-10-CM | POA: Diagnosis not present

## 2023-07-12 DIAGNOSIS — G4733 Obstructive sleep apnea (adult) (pediatric): Secondary | ICD-10-CM | POA: Diagnosis not present

## 2023-07-12 DIAGNOSIS — F331 Major depressive disorder, recurrent, moderate: Secondary | ICD-10-CM | POA: Diagnosis not present

## 2023-07-12 DIAGNOSIS — F411 Generalized anxiety disorder: Secondary | ICD-10-CM | POA: Diagnosis not present

## 2023-07-12 DIAGNOSIS — E039 Hypothyroidism, unspecified: Secondary | ICD-10-CM | POA: Diagnosis not present

## 2023-07-12 DIAGNOSIS — K582 Mixed irritable bowel syndrome: Secondary | ICD-10-CM | POA: Diagnosis not present

## 2023-07-12 MED ORDER — COLESTIPOL HCL 1 G PO TABS
5.0000 g | ORAL_TABLET | Freq: Every day | ORAL | 4 refills | Status: DC
  Filled 2023-11-13: qty 450, 90d supply, fill #0

## 2023-07-12 MED ORDER — LEVOTHYROXINE SODIUM 88 MCG PO TABS
88.0000 ug | ORAL_TABLET | Freq: Every day | ORAL | 4 refills | Status: DC
  Filled 2023-08-20: qty 90, 90d supply, fill #0
  Filled 2023-11-20: qty 90, 90d supply, fill #1
  Filled 2024-02-20: qty 90, 90d supply, fill #2
  Filled 2024-06-15: qty 90, 90d supply, fill #3

## 2023-07-12 MED ORDER — SERTRALINE HCL 50 MG PO TABS
50.0000 mg | ORAL_TABLET | Freq: Every day | ORAL | 3 refills | Status: DC
  Filled 2023-09-06: qty 90, 90d supply, fill #0
  Filled 2023-11-27: qty 90, 90d supply, fill #1
  Filled 2024-03-03: qty 90, 90d supply, fill #2
  Filled 2024-05-07 – 2024-05-29 (×2): qty 90, 90d supply, fill #3

## 2023-07-12 MED ORDER — ATORVASTATIN CALCIUM 10 MG PO TABS
10.0000 mg | ORAL_TABLET | Freq: Every day | ORAL | 4 refills | Status: AC
  Filled 2023-07-12 – 2024-05-07 (×7): qty 90, 90d supply, fill #0

## 2023-07-12 MED ORDER — VITAMIN B-12 1000 MCG PO TABS
2000.0000 ug | ORAL_TABLET | Freq: Every day | ORAL | 4 refills | Status: DC
  Filled 2023-11-13: qty 90, 45d supply, fill #0
  Filled 2024-01-01: qty 180, 90d supply, fill #1
  Filled 2024-05-07: qty 180, 90d supply, fill #2

## 2023-07-12 MED ORDER — LOSARTAN POTASSIUM 25 MG PO TABS
25.0000 mg | ORAL_TABLET | Freq: Every day | ORAL | 4 refills | Status: AC
  Filled 2023-07-12: qty 90, 90d supply, fill #0
  Filled 2023-08-20 – 2023-09-30 (×2): qty 90, 90d supply, fill #1
  Filled 2024-01-10: qty 90, 90d supply, fill #2
  Filled 2024-05-07: qty 90, 90d supply, fill #3

## 2023-07-13 DIAGNOSIS — M79652 Pain in left thigh: Secondary | ICD-10-CM | POA: Diagnosis not present

## 2023-07-13 DIAGNOSIS — R2689 Other abnormalities of gait and mobility: Secondary | ICD-10-CM | POA: Diagnosis not present

## 2023-07-13 DIAGNOSIS — M6281 Muscle weakness (generalized): Secondary | ICD-10-CM | POA: Diagnosis not present

## 2023-07-17 DIAGNOSIS — M6281 Muscle weakness (generalized): Secondary | ICD-10-CM | POA: Diagnosis not present

## 2023-07-17 DIAGNOSIS — R2689 Other abnormalities of gait and mobility: Secondary | ICD-10-CM | POA: Diagnosis not present

## 2023-07-17 DIAGNOSIS — M79652 Pain in left thigh: Secondary | ICD-10-CM | POA: Diagnosis not present

## 2023-07-24 DIAGNOSIS — M6281 Muscle weakness (generalized): Secondary | ICD-10-CM | POA: Diagnosis not present

## 2023-07-24 DIAGNOSIS — R2689 Other abnormalities of gait and mobility: Secondary | ICD-10-CM | POA: Diagnosis not present

## 2023-07-24 DIAGNOSIS — M79652 Pain in left thigh: Secondary | ICD-10-CM | POA: Diagnosis not present

## 2023-08-21 ENCOUNTER — Other Ambulatory Visit (HOSPITAL_COMMUNITY): Payer: Self-pay

## 2023-08-21 ENCOUNTER — Other Ambulatory Visit: Payer: Self-pay

## 2023-08-27 ENCOUNTER — Encounter (HOSPITAL_COMMUNITY): Payer: Self-pay

## 2023-08-27 ENCOUNTER — Other Ambulatory Visit: Payer: Self-pay

## 2023-08-27 ENCOUNTER — Other Ambulatory Visit (HOSPITAL_COMMUNITY): Payer: Self-pay

## 2023-08-27 DIAGNOSIS — Z6833 Body mass index (BMI) 33.0-33.9, adult: Secondary | ICD-10-CM | POA: Diagnosis not present

## 2023-08-27 DIAGNOSIS — M79605 Pain in left leg: Secondary | ICD-10-CM | POA: Diagnosis not present

## 2023-08-28 ENCOUNTER — Other Ambulatory Visit (HOSPITAL_COMMUNITY): Payer: Self-pay

## 2023-08-28 ENCOUNTER — Other Ambulatory Visit: Payer: Self-pay

## 2023-09-06 ENCOUNTER — Other Ambulatory Visit (HOSPITAL_COMMUNITY): Payer: Self-pay

## 2023-09-14 DIAGNOSIS — G4733 Obstructive sleep apnea (adult) (pediatric): Secondary | ICD-10-CM | POA: Diagnosis not present

## 2023-10-01 ENCOUNTER — Other Ambulatory Visit (HOSPITAL_COMMUNITY): Payer: Self-pay

## 2023-10-10 DIAGNOSIS — R32 Unspecified urinary incontinence: Secondary | ICD-10-CM | POA: Diagnosis not present

## 2023-10-17 ENCOUNTER — Other Ambulatory Visit: Payer: Self-pay

## 2023-10-17 ENCOUNTER — Other Ambulatory Visit (HOSPITAL_COMMUNITY): Payer: Self-pay

## 2023-10-18 ENCOUNTER — Other Ambulatory Visit: Payer: Self-pay

## 2023-10-20 ENCOUNTER — Other Ambulatory Visit (HOSPITAL_COMMUNITY): Payer: Self-pay

## 2023-10-23 ENCOUNTER — Other Ambulatory Visit: Payer: Self-pay

## 2023-10-25 ENCOUNTER — Other Ambulatory Visit (HOSPITAL_COMMUNITY): Payer: Self-pay

## 2023-11-13 ENCOUNTER — Other Ambulatory Visit: Payer: Self-pay

## 2023-11-13 ENCOUNTER — Other Ambulatory Visit (HOSPITAL_COMMUNITY): Payer: Self-pay

## 2023-11-20 DIAGNOSIS — R152 Fecal urgency: Secondary | ICD-10-CM | POA: Diagnosis not present

## 2023-11-20 DIAGNOSIS — Z79899 Other long term (current) drug therapy: Secondary | ICD-10-CM | POA: Diagnosis not present

## 2023-11-20 DIAGNOSIS — Z1211 Encounter for screening for malignant neoplasm of colon: Secondary | ICD-10-CM | POA: Diagnosis not present

## 2023-11-20 DIAGNOSIS — R159 Full incontinence of feces: Secondary | ICD-10-CM | POA: Diagnosis not present

## 2023-11-21 ENCOUNTER — Other Ambulatory Visit: Payer: Self-pay

## 2023-11-21 ENCOUNTER — Other Ambulatory Visit (HOSPITAL_COMMUNITY): Payer: Self-pay

## 2023-11-26 DIAGNOSIS — Z01818 Encounter for other preprocedural examination: Secondary | ICD-10-CM | POA: Diagnosis not present

## 2023-11-26 DIAGNOSIS — H02831 Dermatochalasis of right upper eyelid: Secondary | ICD-10-CM | POA: Diagnosis not present

## 2023-11-26 DIAGNOSIS — H0279 Other degenerative disorders of eyelid and periocular area: Secondary | ICD-10-CM | POA: Diagnosis not present

## 2023-11-26 DIAGNOSIS — H02411 Mechanical ptosis of right eyelid: Secondary | ICD-10-CM | POA: Diagnosis not present

## 2023-11-26 DIAGNOSIS — H02834 Dermatochalasis of left upper eyelid: Secondary | ICD-10-CM | POA: Diagnosis not present

## 2023-11-26 DIAGNOSIS — H57813 Brow ptosis, bilateral: Secondary | ICD-10-CM | POA: Diagnosis not present

## 2023-11-26 DIAGNOSIS — H02412 Mechanical ptosis of left eyelid: Secondary | ICD-10-CM | POA: Diagnosis not present

## 2023-11-26 DIAGNOSIS — H53483 Generalized contraction of visual field, bilateral: Secondary | ICD-10-CM | POA: Diagnosis not present

## 2023-11-26 DIAGNOSIS — H02413 Mechanical ptosis of bilateral eyelids: Secondary | ICD-10-CM | POA: Diagnosis not present

## 2023-11-28 ENCOUNTER — Other Ambulatory Visit: Payer: Self-pay

## 2023-11-29 ENCOUNTER — Other Ambulatory Visit: Payer: Self-pay

## 2023-12-11 DIAGNOSIS — Z1211 Encounter for screening for malignant neoplasm of colon: Secondary | ICD-10-CM | POA: Diagnosis not present

## 2023-12-19 DIAGNOSIS — Z1211 Encounter for screening for malignant neoplasm of colon: Secondary | ICD-10-CM | POA: Diagnosis not present

## 2023-12-19 DIAGNOSIS — R152 Fecal urgency: Secondary | ICD-10-CM | POA: Diagnosis not present

## 2023-12-19 DIAGNOSIS — R159 Full incontinence of feces: Secondary | ICD-10-CM | POA: Diagnosis not present

## 2023-12-25 DIAGNOSIS — K529 Noninfective gastroenteritis and colitis, unspecified: Secondary | ICD-10-CM | POA: Diagnosis not present

## 2023-12-26 DIAGNOSIS — R152 Fecal urgency: Secondary | ICD-10-CM | POA: Diagnosis not present

## 2023-12-26 DIAGNOSIS — R159 Full incontinence of feces: Secondary | ICD-10-CM | POA: Diagnosis not present

## 2024-01-01 ENCOUNTER — Other Ambulatory Visit (HOSPITAL_COMMUNITY): Payer: Self-pay

## 2024-01-03 DIAGNOSIS — D692 Other nonthrombocytopenic purpura: Secondary | ICD-10-CM | POA: Diagnosis not present

## 2024-01-03 DIAGNOSIS — D229 Melanocytic nevi, unspecified: Secondary | ICD-10-CM | POA: Diagnosis not present

## 2024-01-03 DIAGNOSIS — L821 Other seborrheic keratosis: Secondary | ICD-10-CM | POA: Diagnosis not present

## 2024-01-03 DIAGNOSIS — L820 Inflamed seborrheic keratosis: Secondary | ICD-10-CM | POA: Diagnosis not present

## 2024-01-03 DIAGNOSIS — L814 Other melanin hyperpigmentation: Secondary | ICD-10-CM | POA: Diagnosis not present

## 2024-01-07 DIAGNOSIS — H53483 Generalized contraction of visual field, bilateral: Secondary | ICD-10-CM | POA: Diagnosis not present

## 2024-01-09 DIAGNOSIS — E039 Hypothyroidism, unspecified: Secondary | ICD-10-CM | POA: Diagnosis not present

## 2024-01-09 DIAGNOSIS — I672 Cerebral atherosclerosis: Secondary | ICD-10-CM | POA: Diagnosis not present

## 2024-01-09 DIAGNOSIS — E538 Deficiency of other specified B group vitamins: Secondary | ICD-10-CM | POA: Diagnosis not present

## 2024-01-09 DIAGNOSIS — K9089 Other intestinal malabsorption: Secondary | ICD-10-CM | POA: Diagnosis not present

## 2024-01-09 DIAGNOSIS — E669 Obesity, unspecified: Secondary | ICD-10-CM | POA: Diagnosis not present

## 2024-01-09 DIAGNOSIS — Z131 Encounter for screening for diabetes mellitus: Secondary | ICD-10-CM | POA: Diagnosis not present

## 2024-01-09 DIAGNOSIS — D320 Benign neoplasm of cerebral meninges: Secondary | ICD-10-CM | POA: Diagnosis not present

## 2024-01-09 DIAGNOSIS — E785 Hyperlipidemia, unspecified: Secondary | ICD-10-CM | POA: Diagnosis not present

## 2024-01-09 DIAGNOSIS — E559 Vitamin D deficiency, unspecified: Secondary | ICD-10-CM | POA: Diagnosis not present

## 2024-01-09 DIAGNOSIS — H0233 Blepharochalasis right eye, unspecified eyelid: Secondary | ICD-10-CM | POA: Diagnosis not present

## 2024-01-09 DIAGNOSIS — H0236 Blepharochalasis left eye, unspecified eyelid: Secondary | ICD-10-CM | POA: Diagnosis not present

## 2024-01-09 DIAGNOSIS — N1832 Chronic kidney disease, stage 3b: Secondary | ICD-10-CM | POA: Diagnosis not present

## 2024-01-09 LAB — LAB REPORT - SCANNED
A1c: 5.7
EGFR: 52
Free T4: 1.12
TSH: 0.62 (ref 0.41–5.90)

## 2024-01-10 ENCOUNTER — Other Ambulatory Visit (HOSPITAL_COMMUNITY): Payer: Self-pay

## 2024-01-14 ENCOUNTER — Other Ambulatory Visit (HOSPITAL_COMMUNITY): Payer: Self-pay

## 2024-01-14 ENCOUNTER — Other Ambulatory Visit: Payer: Self-pay

## 2024-01-17 ENCOUNTER — Ambulatory Visit: Payer: PPO | Admitting: Adult Health

## 2024-01-17 ENCOUNTER — Encounter: Payer: Self-pay | Admitting: Adult Health

## 2024-01-17 VITALS — BP 139/72 | HR 89 | Ht 67.0 in | Wt 215.0 lb

## 2024-01-17 DIAGNOSIS — D320 Benign neoplasm of cerebral meninges: Secondary | ICD-10-CM | POA: Diagnosis not present

## 2024-01-17 DIAGNOSIS — G4733 Obstructive sleep apnea (adult) (pediatric): Secondary | ICD-10-CM | POA: Diagnosis not present

## 2024-01-17 NOTE — Progress Notes (Signed)
 Saif Peter D, CMA  Joylene Bradley; Garcia, Patricia; Ziegler, Melissa; Tucker, Dolanda; Cain, Rutledge New orders have been placed for the above pt, DOB: 2051-01-12 Thanks

## 2024-01-17 NOTE — Patient Instructions (Addendum)
 Your Plan:  Continue nightly use of CPAP with greater than 4 hours per night for optimal benefit and per insurance requirements  Continue to follow with [facility] for any needed supplies or CPAP related concerns. Please be sure to change your filter every month, your mask about every 3 months, hose about every 6 months, humidifier chamber about yearly. Some restrictions are imposed by your insurance carrier with regard to how frequently you can get certain supplies. Your DME company can provide further details if necessary.       Follow-up in 1 year or call earlier if needed     Thank you for coming to see us  at [facility]. I hope we have been able to provide you high quality care today.  You may receive a patient satisfaction survey over the next few weeks. We would appreciate your feedback and comments so that we may continue to improve ourselves and the health of our patients.

## 2024-01-17 NOTE — Progress Notes (Signed)
 [facility] [address] [place]. [place] [address] [phone]       OFFICE FOLLOW UP NOTE  Ms. [name] Date of Birth:  01-01-1951 Medical Record Number:  [id number]     Reason for visit: OSA on CPAP    SUBJECTIVE:   CHIEF COMPLAINT:  Chief Complaint  Patient presents with   Obstructive Sleep Apnea    Rm 3 alone Ot is well and stable. Reports no OSA/CPAP concerns other than mask leaving indention on face.     Follow-up visit  Prior visit: 01/17/2023 with Dr. Chalice   Brief HPI:   Brandi Potter is a 73 y.o. female who is followed for OSA on CPAP and prior complaints of muscle twitching. MRI cervical did not show evidence of myelopathy or canal stenosis that would explain brisk reflexes and muscle twitching, noted left shoulder, neck and upper arm may be affected by a pinched nerve but there is no explanation for overall hyperreflexia.  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   At prior visit with Dr. Chalice, noted twitching was less and not overly bothered by this.  Noted excellent CPAP compliance and optimal residual AHI.    Interval history:  Returns today for CPAP follow-up visit.  Continues to do well with CPAP therapy. She does complain of mask leaving marks on face. Using N30i mask which she overall tolerates well. She does report continued benefit in regards to sleep quality and daytime energy levels. ESS 4/24.   She also mentions last November she had an episode of speech difficulty and confusion while at ophthalmology appointment. She was sent to [facility] ED, MRI negative for acute stroke but did show small 2.2 x 1.8 x 1.5 cm right temporal meningioma  (provided copy of report today).  She has since established care with Dr. Lanis and plans on repeat imaging for monitoring.         ROS:   14 system review of  systems performed and negative with exception of those listed in HPI  PMH:  Past Medical History:  Diagnosis Date   Anal fissure    Hemorrhoids    High cholesterol    History of colon polyps    Hypertension    Hypothyroidism    RA (rheumatoid arthritis) (HCC)    Stage 3a chronic kidney disease (CKD) (HCC) 2022   [facility] Dr Dalene    PSH:  Past Surgical History:  Procedure Laterality Date   COLONOSCOPY  05/08/2014   Dr larene. Mild diverticular disease left colon. External hemorrhoids.   ESOPHAGOGASTRODUODENOSCOPY  05/08/2014   Dr larene. Normal upper endoscopy. Empiric esophageal dilation to 15 French   HEMORRHOID SURGERY  1999   Dr Leota   HERNIA REPAIR  08/14/2019   periumbillical hernia   TONSILLECTOMY      Social History:  Social History   Socioeconomic History   Marital status: Single    Spouse name: Not on file   Number of children: Not on file   Years of education: Not on file   Highest education level: Not on file  Occupational History   Not on file  Tobacco Use   Smoking status: Former   Smokeless tobacco: Never  Vaping Use   Vaping status: Never Used  Substance and Sexual Activity   Alcohol use: Yes    Comment: ocassionally   Drug use: Not Currently   Sexual  activity: Not on file  Other Topics Concern   Not on file  Social History Narrative   Not on file   Social Drivers of Health   Financial Resource Strain: Not on file  Food Insecurity: Not on file  Transportation Needs: Not on file  Physical Activity: Not on file  Stress: Not on file  Social Connections: Not on file  Intimate Partner Violence: Not on file    Family History:  Family History  Problem Relation Age of Onset   Cancer Father        nasopharyngeal   Colon cancer Neg Hx     Medications:   Current Outpatient Medications on File Prior to Visit  Medication Sig Dispense Refill   atorvastatin  (LIPITOR) 10 MG tablet Take 1 tablet (10 mg total)  by mouth at bedtime. 90 tablet 4   atorvastatin  (LIPITOR) 10 MG tablet Take 1 tablet (10 mg total) by mouth at bedtime. 90 tablet 4   colestipol  (COLESTID ) 1 g tablet Take 5 tablets (5 g total) by mouth daily. 450 tablet 4   cyanocobalamin  (VITAMIN B12) 1000 MCG tablet Take 2 tablets (2,000 mcg total) by mouth daily. 90 tablet 4   cyanocobalamin  (VITAMIN B12) 1000 MCG tablet Take 2 tablets (2,000 mcg total) by mouth daily. 90 tablet 4   levothyroxine  (SYNTHROID ) 88 MCG tablet Take 1 tablet (88 mcg total) by mouth daily. 90 tablet 4   levothyroxine  (SYNTHROID ) 88 MCG tablet Take 1 tablet (88 mcg total) by mouth daily. 90 tablet 4   losartan  (COZAAR ) 25 MG tablet Take 1 tablet (25 mg total) by mouth at bedtime. 90 tablet 4   losartan  (COZAAR ) 50 MG tablet Take 1 tablet (50 mg total) by mouth at bedtime. 90 tablet 4   progesterone  (PROMETRIUM ) 200 MG capsule Take 1 capsule (200 mg total) by mouth daily. 90 capsule 3   sertraline  (ZOLOFT ) 50 MG tablet Take 1 tablet (50 mg total) by mouth daily. 90 tablet 3   sertraline  (ZOLOFT ) 50 MG tablet Take 1 tablet (50 mg total) by mouth daily. 90 tablet 3   TURMERIC PO Take 2,400 mg by mouth in the morning and at bedtime.     colestipol  (COLESTID ) 1 g tablet Take 5 tablets (5 g total) by mouth daily. 450 tablet 4   No current facility-administered medications on file prior to visit.    Allergies:   Allergies  Allergen Reactions   Aleve [Naproxen Sodium] Hives      OBJECTIVE:  Physical Exam  Vitals:   01/17/24 1052  BP: 139/72  Pulse: 89  Weight: 215 lb (97.5 kg)  Height: 5' 7 (1.702 m)    Body mass index is 33.67 kg/m. No results found.  General: well developed, well nourished, very pleasant elderly Caucasian female, seated, in no evident distress Head: head normocephalic and atraumatic.   Neck: supple with no carotid or supraclavicular bruits Cardiovascular: regular rate and rhythm, no murmurs Musculoskeletal: no deformity Skin:   no rash/petichiae Vascular:  Normal pulses all extremities   Neurologic Exam Mental Status: Awake and fully alert. Oriented to place and time. Recent and remote memory intact. Attention span, concentration and fund of knowledge appropriate. Mood and affect appropriate.  Cranial Nerves: Pupils equal, briskly reactive to light. Extraocular movements full without nystagmus. Visual fields full to confrontation. Hearing intact. Facial sensation intact. Face, tongue, palate moves normally and symmetrically.  Motor: Normal bulk and tone. Normal strength in all tested extremity muscles Sensory.: intact to touch ,  pinprick , position and vibratory sensation.  Coordination: Rapid alternating movements normal in all extremities. Finger-to-nose and heel-to-shin performed accurately bilaterally. Gait and Station: Arises from chair without difficulty. Stance is normal. Gait demonstrates normal stride length and balance without use of AD. Tandem walk and heel toe without difficulty.  Reflexes: 1+ and symmetric. Toes downgoing.         ASSESSMENT/PLAN: Brandi Potter is a 73 y.o. year old female followed by Dr. Chalice for OSA on CPAP and prior complaints of generalized hyperreflexia and muscle twitching without clear etiology.  EMG/NCV and cervical imaging unremarkable. Found to have small right temporal meningioma  in 04/2023 and continues to follow with Dr. Lanis.     Compliance report shows satisfactory usage with optimal residual AHI.   Continue current pressure settings of 6-16 with EPR 3 Discussed continued nightly usage with ensuring greater than 4 hours nightly for optimal benefit and per insurance purposes.   Continue to follow with DME company for any needed supplies or CPAP related concerns CPAP set up 01/2021, Luna machine  Continue to follow with Dr. Lanis for monitoring of meningioma. Will send copy of MRI report from 04/2023 to medical records to be scanned into chart.      Follow-up in 1 year or call earlier if needed     CC:  PCP: Street, Lonni HERO, MD    I personally spent a total of 30 minutes in the care of the patient today including preparing to see the patient, performing a medically appropriate exam/evaluation, counseling and educating, placing orders, and documenting clinical information in the EHR.    Harlene Bogaert, AGNP-BC  [facility] [address] [place], [place] [address]  Phone [phone] Fax [phone] Note: This document was prepared with digital dictation and possible smart phrase technology. Any transcriptional errors that result from this process are unintentional.

## 2024-01-24 ENCOUNTER — Ambulatory Visit: Admitting: Podiatry

## 2024-01-24 DIAGNOSIS — L84 Corns and callosities: Secondary | ICD-10-CM

## 2024-01-24 DIAGNOSIS — M205X1 Other deformities of toe(s) (acquired), right foot: Secondary | ICD-10-CM

## 2024-01-24 NOTE — Progress Notes (Signed)
     Chief Complaint  Patient presents with   Callouses    Right 5th, medial side of the tip of the toe. She has been using foam toe separators and a band aid. The callous/corn area doe go to the cuticle and the nail will aggravate it. She has been working on it for a year on her own. Not diabetic and no anti coag.    HPI: 73 y.o. female presents today with concern of pain to the right fifth toe.  She notes that there was a certain pair of sketcher shoes that put extra pressure on her toe.  She states that she has been trying to shave the corn that has developed on her own and has been using a foam spacer.  Past Medical History:  Diagnosis Date   Anal fissure    Hemorrhoids    High cholesterol    History of colon polyps    Hypertension    Hypothyroidism    RA (rheumatoid arthritis) (HCC)    Stage 3a chronic kidney disease (CKD) (HCC) 2022   [facility] Dr Dalene   Past Surgical History:  Procedure Laterality Date   COLONOSCOPY  05/08/2014   Dr larene. Mild diverticular disease left colon. External hemorrhoids.   ESOPHAGOGASTRODUODENOSCOPY  05/08/2014   Dr larene. Normal upper endoscopy. Empiric esophageal dilation to 39 French   HEMORRHOID SURGERY  1999   Dr Leota   HERNIA REPAIR  08/14/2019   periumbillical hernia   TONSILLECTOMY     Allergies  Allergen Reactions   Aleve [Naproxen Sodium] Hives    Physical Exam: Palpable pedal pulses.  Adductovarus deformity of the right fifth toe.  This is worsened with loading of the forefoot mimicking weightbearing position.  The fifth toe starts to underlapped the fourth.  There is a hyperkeratotic lesion on the lateral aspect of the fourth toe PIPJ, the medial aspect of the fifth toe DIPJ, and the dorsal lateral aspect of the fifth toe PIPJ.  No ulceration is noted.  No clinical signs of infection are seen.  Epicritic sensation is intact.  Assessment/Plan of Care: 1. Corns   2. Acquired adductovarus rotation of  toe, right     The corns in the right fourth interspace and on the dorsal lateral aspect of the right fifth toe PIPJ were shaved uneventfully with a sterile #[id number] blade.  Toe spacers were fitted and dispensed today for the patient to try to keep pressure away from the 4th and 5th toes.  She was informed that if the pressure continues, the corns will recur.  Avoid shoes that put side-to-side pressure on that fifth toe.  Briefly discussed surgery if conservative measures are unsuccessful.  Follow-up as needed   Awanda CHARM Imperial, DPM, FACFAS [facility]     [address] [place], [place] [address]                Office [phone]  Fax [phone]

## 2024-02-12 ENCOUNTER — Other Ambulatory Visit (HOSPITAL_COMMUNITY): Payer: Self-pay

## 2024-02-12 DIAGNOSIS — R152 Fecal urgency: Secondary | ICD-10-CM | POA: Diagnosis not present

## 2024-02-12 DIAGNOSIS — R159 Full incontinence of feces: Secondary | ICD-10-CM | POA: Diagnosis not present

## 2024-02-12 DIAGNOSIS — K52832 Lymphocytic colitis: Secondary | ICD-10-CM | POA: Diagnosis not present

## 2024-02-20 ENCOUNTER — Other Ambulatory Visit (HOSPITAL_COMMUNITY): Payer: Self-pay

## 2024-02-21 ENCOUNTER — Other Ambulatory Visit: Payer: Self-pay

## 2024-03-04 ENCOUNTER — Other Ambulatory Visit (HOSPITAL_COMMUNITY): Payer: Self-pay

## 2024-03-19 DIAGNOSIS — G4733 Obstructive sleep apnea (adult) (pediatric): Secondary | ICD-10-CM | POA: Diagnosis not present

## 2024-04-02 DIAGNOSIS — Z23 Encounter for immunization: Secondary | ICD-10-CM | POA: Diagnosis not present

## 2024-04-02 DIAGNOSIS — Z6832 Body mass index (BMI) 32.0-32.9, adult: Secondary | ICD-10-CM | POA: Diagnosis not present

## 2024-04-02 DIAGNOSIS — R002 Palpitations: Secondary | ICD-10-CM | POA: Diagnosis not present

## 2024-04-08 DIAGNOSIS — K52832 Lymphocytic colitis: Secondary | ICD-10-CM | POA: Diagnosis not present

## 2024-04-17 DIAGNOSIS — H53483 Generalized contraction of visual field, bilateral: Secondary | ICD-10-CM | POA: Diagnosis not present

## 2024-04-17 DIAGNOSIS — H02412 Mechanical ptosis of left eyelid: Secondary | ICD-10-CM | POA: Diagnosis not present

## 2024-04-17 DIAGNOSIS — H02831 Dermatochalasis of right upper eyelid: Secondary | ICD-10-CM | POA: Diagnosis not present

## 2024-04-17 DIAGNOSIS — H02411 Mechanical ptosis of right eyelid: Secondary | ICD-10-CM | POA: Diagnosis not present

## 2024-04-17 DIAGNOSIS — H02834 Dermatochalasis of left upper eyelid: Secondary | ICD-10-CM | POA: Diagnosis not present

## 2024-04-17 DIAGNOSIS — H57813 Brow ptosis, bilateral: Secondary | ICD-10-CM | POA: Diagnosis not present

## 2024-04-17 DIAGNOSIS — H02413 Mechanical ptosis of bilateral eyelids: Secondary | ICD-10-CM | POA: Diagnosis not present

## 2024-04-17 DIAGNOSIS — H0279 Other degenerative disorders of eyelid and periocular area: Secondary | ICD-10-CM | POA: Diagnosis not present

## 2024-04-30 DIAGNOSIS — Z6833 Body mass index (BMI) 33.0-33.9, adult: Secondary | ICD-10-CM | POA: Diagnosis not present

## 2024-04-30 DIAGNOSIS — Z1231 Encounter for screening mammogram for malignant neoplasm of breast: Secondary | ICD-10-CM | POA: Diagnosis not present

## 2024-04-30 DIAGNOSIS — Z01419 Encounter for gynecological examination (general) (routine) without abnormal findings: Secondary | ICD-10-CM | POA: Diagnosis not present

## 2024-05-07 ENCOUNTER — Other Ambulatory Visit (HOSPITAL_BASED_OUTPATIENT_CLINIC_OR_DEPARTMENT_OTHER): Payer: Self-pay

## 2024-05-07 ENCOUNTER — Other Ambulatory Visit (HOSPITAL_COMMUNITY): Payer: Self-pay

## 2024-05-07 ENCOUNTER — Other Ambulatory Visit: Payer: Self-pay

## 2024-05-28 DIAGNOSIS — D329 Benign neoplasm of meninges, unspecified: Secondary | ICD-10-CM | POA: Diagnosis not present

## 2024-05-30 ENCOUNTER — Other Ambulatory Visit: Payer: Self-pay

## 2024-06-15 ENCOUNTER — Other Ambulatory Visit (HOSPITAL_COMMUNITY): Payer: Self-pay

## 2024-06-16 ENCOUNTER — Other Ambulatory Visit (HOSPITAL_COMMUNITY): Payer: Self-pay

## 2024-06-16 MED ORDER — LEVOTHYROXINE SODIUM 88 MCG PO TABS
88.0000 ug | ORAL_TABLET | Freq: Every day | ORAL | 4 refills | Status: AC
  Filled 2024-06-16: qty 90, 90d supply, fill #0

## 2024-06-16 MED ORDER — LOSARTAN POTASSIUM 25 MG PO TABS
25.0000 mg | ORAL_TABLET | Freq: Every day | ORAL | 4 refills | Status: DC
  Filled 2024-06-16: qty 90, 90d supply, fill #0

## 2024-06-16 MED ORDER — SERTRALINE HCL 50 MG PO TABS
50.0000 mg | ORAL_TABLET | Freq: Every day | ORAL | 3 refills | Status: DC
  Filled 2024-06-16: qty 90, 90d supply, fill #0

## 2024-06-16 MED ORDER — ATORVASTATIN CALCIUM 10 MG PO TABS
10.0000 mg | ORAL_TABLET | Freq: Every day | ORAL | 4 refills | Status: DC
  Filled 2024-06-16: qty 90, 90d supply, fill #0

## 2024-06-16 MED ORDER — VITAMIN B-12 1000 MCG PO TABS
2000.0000 ug | ORAL_TABLET | Freq: Every day | ORAL | 4 refills | Status: DC
  Filled 2024-06-16: qty 90, 45d supply, fill #0

## 2024-06-17 ENCOUNTER — Other Ambulatory Visit (HOSPITAL_COMMUNITY): Payer: Self-pay

## 2024-06-24 ENCOUNTER — Ambulatory Visit: Payer: Medicare (Managed Care)

## 2024-06-24 VITALS — BP 126/68 | HR 67 | Temp 97.9°F | Ht 67.0 in | Wt 212.0 lb

## 2024-06-24 DIAGNOSIS — H538 Other visual disturbances: Secondary | ICD-10-CM | POA: Diagnosis not present

## 2024-06-24 DIAGNOSIS — E039 Hypothyroidism, unspecified: Secondary | ICD-10-CM | POA: Insufficient documentation

## 2024-06-24 DIAGNOSIS — G4733 Obstructive sleep apnea (adult) (pediatric): Secondary | ICD-10-CM | POA: Diagnosis not present

## 2024-06-24 DIAGNOSIS — I10 Essential (primary) hypertension: Secondary | ICD-10-CM | POA: Diagnosis not present

## 2024-06-24 DIAGNOSIS — D320 Benign neoplasm of cerebral meninges: Secondary | ICD-10-CM | POA: Insufficient documentation

## 2024-06-24 DIAGNOSIS — F419 Anxiety disorder, unspecified: Secondary | ICD-10-CM | POA: Diagnosis not present

## 2024-06-24 DIAGNOSIS — E785 Hyperlipidemia, unspecified: Secondary | ICD-10-CM | POA: Insufficient documentation

## 2024-06-24 NOTE — Assessment & Plan Note (Signed)
 Cerebral meningioma, stable Meningioma well-managed with no changes on recent MRI. Follow-up with neurologist scheduled in a year and a half. - Continue annual MRI monitoring.

## 2024-06-24 NOTE — Progress Notes (Signed)
 "  Subjective:  Patient ID: Brandi Potter, female    DOB: 02-Jan-1951  Age: 74 y.o. MRN: [id number]  Chief Complaint  Patient presents with   Establish Care    HPI:  Patient is establishing as a new patient.  Discussed the use of AI scribe software for clinical note transcription with the patient, who gave verbal consent to proceed.   History of Present Illness   BROOKIE WAYMENT is a 74 year old female who presents TO ESTABLISH CARE.  Obstructive sleep apnea - Managed with CPAP therapy for the past four years - Describes condition as moderate - Uncertain of exact CPAP settings  Meningioma surveillance - Small, stable meningioma discovered on MRI after suspected stroke episode - Annual follow-up MRIs - Most recent MRI one month ago showed no changes  Fecal incontinence - Issues with stool control - Dietary modifications and increased calcium  intake have improved symptoms of incontinence Has tried interstim with not much benefit - Mostly at home and monitors diet to avoid triggers  Visual disturbance post-cataract surgery - History of bilateral cataract surgery, right eye in April 2024 - Blurriness in right eye, especially when driving and reading signs - Optometrist found no changes - Seeking referral to cataract surgeon for further evaluation  Vitamin b12 deficiency and neurological symptoms - History of excessive caffeine intake, previously consumed large amounts of [employer] - Significantly reduced caffeine intake, now occasionally drinks ginger ale - Health episode in November 2024 led to discovery of B12 deficiency - Currently taking B12 supplements - Symptoms such as tremors have improved with supplementation  Chronic medication management - Atorvastatin  for hyperlipidemia - Levothyroxine  for hypothyroidism - Losartan  for hypertension - Sertraline  for anxiety and depression, on since age 72 - Sertraline  has provided benefit during significant life events  Carpal  tunnel syndrome - History of carpal tunnel syndrome in left wrist - Recent recurrence of pain in left wrist - Previously managed with wrist splints, considering resuming use if pain worsens  Lifestyle modifications - Participates in [employer] exercise classes three times per week - Has made dietary changes to manage health conditions             06/24/2024    2:38 PM  Depression screen PHQ 2/9  Decreased Interest 0  Down, Depressed, Hopeless 1  PHQ - 2 Score 1         06/24/2024    2:38 PM  Fall Risk  Falls in the past year? 0  Was there an injury with Fall? 0  Fall Risk Category Calculator 0  Patient at Risk for Falls Due to No Fall Risks  Fall risk Follow up Falls evaluation completed     Medications Ordered Prior to Encounter[1]. Social History   Socioeconomic History   Marital status: Single    Spouse name: Not on file   Number of children: 1   Years of education: Not on file   Highest education level: Associate degree: academic program  Occupational History   Not on file  Tobacco Use   Smoking status: Former    Current packs/day: 0.00    Types: Cigarettes    Quit date: 1971    Years since quitting: 55.0   Smokeless tobacco: Never  Vaping Use   Vaping status: Never Used  Substance and Sexual Activity   Alcohol use: Yes    Comment: ocassionally   Drug use: Never   Sexual activity: Not Currently  Other Topics Concern   Not on file  Social History Narrative   Not on file   Social Drivers of Health   Tobacco Use: Medium Risk (06/24/2024)   Patient History    Smoking Tobacco Use: Former    Smokeless Tobacco Use: Never    Passive Exposure: Not on file  Financial Resource Strain: Low Risk (06/24/2024)   Overall Financial Resource Strain (CARDIA)    Difficulty of Paying Living Expenses: Not hard at all  Food Insecurity: No Food Insecurity (06/24/2024)   Epic    Worried About Programme Researcher, Broadcasting/film/video in the Last Year: Never true    Ran Out of Food in the  Last Year: Never true  Transportation Needs: No Transportation Needs (06/24/2024)   Epic    Lack of Transportation (Medical): No    Lack of Transportation (Non-Medical): No  Physical Activity: Sufficiently Active (06/24/2024)   Exercise Vital Sign    Days of Exercise per Week: 3 days    Minutes of Exercise per Session: 60 min  Stress: No Stress Concern Present (06/24/2024)   [employer] of Occupational Health - Occupational Stress Questionnaire    Feeling of Stress: Not at all  Social Connections: Socially Isolated (06/24/2024)   Social Connection and Isolation Panel    Frequency of Communication with Friends and Family: More than three times a week    Frequency of Social Gatherings with Friends and Family: More than three times a week    Attends Religious Services: Never    Database Administrator or Organizations: No    Attends Banker Meetings: Never    Marital Status: Divorced  Depression (PHQ2-9): Low Risk (06/24/2024)   Depression (PHQ2-9)    PHQ-2 Score: 1  Alcohol Screen: Low Risk (06/24/2024)   Alcohol Screen    Last Alcohol Screening Score (AUDIT): 0  Housing: Unknown (06/24/2024)   Epic    Unable to Pay for Housing in the Last Year: No    Number of Times Moved in the Last Year: Not on file    Homeless in the Last Year: No  Utilities: Not At Risk (06/24/2024)   Epic    Threatened with loss of utilities: No  Health Literacy: Adequate Health Literacy (06/24/2024)   B1300 Health Literacy    Frequency of need for help with medical instructions: Rarely   Past Medical History:  Diagnosis Date   Anal fissure    Hemorrhoids    High cholesterol    History of colon polyps    Hypertension    Hypothyroidism    RA (rheumatoid arthritis) (HCC)    Stage 3a chronic kidney disease (CKD) (HCC) 2022   [facility] Dr Dalene   Family History  Problem Relation Age of Onset   Thyroid  disease Mother    Heart attack Mother    Cancer Father        nasopharyngeal    Hyperlipidemia Sister    Dementia Sister    Thyroid  disease Sister    Hypertension Sister    Thyroid  disease Sister    Thyroid  disease Brother    Other Brother        polio   Thyroid  disease Brother    Colon cancer Neg Hx     Review of Systems  Constitutional:  Negative for chills, fatigue and fever.  HENT:  Negative for congestion, ear pain, sinus pressure and sore throat.   Eyes:  Positive for visual disturbance (right eye blurry vision).  Respiratory:  Negative for cough and shortness of breath.  Cardiovascular:  Negative for chest pain.  Gastrointestinal:  Negative for abdominal pain, constipation, diarrhea, nausea and vomiting.  Genitourinary:  Negative for dysuria and frequency.  Musculoskeletal:  Negative for arthralgias, back pain and myalgias.  Neurological:  Negative for dizziness and headaches.       Left wrist pain  Psychiatric/Behavioral:  Negative for dysphoric mood. The patient is not nervous/anxious.      Objective:  BP 126/68   Pulse 67   Temp 97.9 F (36.6 C)   Ht 5' 7 (1.702 m)   Wt 212 lb (96.2 kg)   SpO2 96%   BMI 33.20 kg/m      06/24/2024    2:32 PM 01/17/2024   10:52 AM 01/17/2023    9:29 AM  BP/Weight  Systolic BP 126 139 143  Diastolic BP 68 72 77  Wt. (Lbs) 212 215 224  BMI 33.2 kg/m2 33.67 kg/m2 35.08 kg/m2    Physical Exam Vitals and nursing note reviewed.  Constitutional:      Appearance: She is obese.  HENT:     Head: Normocephalic and atraumatic.  Cardiovascular:     Rate and Rhythm: Normal rate and regular rhythm.  Pulmonary:     Breath sounds: Normal breath sounds.  Musculoskeletal:        General: Normal range of motion.  Neurological:     General: No focal deficit present.     Mental Status: She is alert and oriented to person, place, and time.  Psychiatric:        Mood and Affect: Mood normal.        Lab Results  Component Value Date   GLUCOSE 85 06/15/2022   ALT 20 06/15/2022   AST 19 06/15/2022   NA 141  06/15/2022   K 5.1 06/15/2022   CL 105 06/15/2022   CREATININE 1.44 (H) 06/15/2022   BUN 19 06/15/2022   CO2 21 06/15/2022   TSH 0.62 01/09/2024      Assessment & Plan:  Cerebral meningioma [facility]) Assessment & Plan: Cerebral meningioma, stable Meningioma well-managed with no changes on recent MRI. Follow-up with neurologist scheduled in a year and a half. - Continue annual MRI monitoring.   History of caffeine use  OSA on CPAP Assessment & Plan:  Obstructive sleep apnea Moderate obstructive sleep apnea managed with CPAP for four years. Compliant with cpap use   Essential hypertension Assessment & Plan:  Essential hypertension Hypertension managed with losartan  25 mg. Previous higher dose caused sharp head pain, resolved with dose reduction. - Continue losartan  25 mg daily.   Hyperlipidemia LDL goal <130 Assessment & Plan:  Hyperlipidemia Managed with atorvastatin  at a low dose of 10 mg.. Recent cholesterol panel excellent. - Continue atorvastatin  as prescribed.   Acquired hypothyroidism Assessment & Plan:  Acquired hypothyroidism Managed with levothyroxine  88 mcg. Thyroid  levels normal on recent blood work. - Continue levothyroxine  88 mcg daily.   Chronic anxiety Assessment & Plan: Chronic anxiety and depression Managed with sertraline  50 mg daily. Long-term use for anxiety and depression, particularly during stressful life events. - Continue sertraline  50 mg daily.   Blurred vision, right eye Assessment & Plan:  Blurred vision, right eye Blurred vision in right eye, particularly when driving. Previous cataract surgery in 2024. Referral to ophthalmologist Dr. Milan for further evaluation. - Referred to Dr. Milan for ophthalmology evaluation.  Orders: -     Ambulatory referral to Ophthalmology         Overall healthy with regular exercise participation. No  significant allergies except hives from naproxen. Blood work shows normal hemoglobin,  hematocrit, and A1c. Cholesterol panel excellent. Kidney filtration rate at 58, indicating chronic kidney disease stage 3. Thyroid  levels normal. B12 level elevated at 1882, above normal range. Vitamin D levels excellent. - Continue current exercise regimen. - Maintain healthy diet. - Monitor water intake and blood pressure. - Reduce B12 intake to one tablet daily. - Will repeat blood work in three months. - Ensure calcium  supplement includes vitamin D.   Kidney filtration rate at 58, I do not have any other numbers to compare to stage.  No acute issues reported. - Monitor kidney function. - Maintain adequate hydration. - Control blood pressure. - Limit salt intake.    Carpal tunnel syndrome, left wrist Intermittent pain in left wrist, previously diagnosed with carpal tunnel syndrome. Pain not severe enough for surgical intervention. - Consider carpal tunnel splints or braces if pain worsens. - Perform carpal tunnel exercises at home.       No orders of the defined types were placed in this encounter.  Orders Placed This Encounter  Procedures   Ambulatory referral to Ophthalmology     I personally spent a total of 50 minutes in the care of the patient today including preparing to see the patient, getting/reviewing separately obtained history, performing a medically appropriate exam/evaluation, placing orders, and documenting clinical information in the EHR.     .   Follow-up: Return in about 3 months (around 09/22/2024) for chronic disease follow up.  AVS was given to patient prior to departure.  Gerad Cornelio [facility] [phone]     [1]  Current Outpatient Medications on File Prior to Visit  Medication Sig Dispense Refill   atorvastatin  (LIPITOR) 10 MG tablet Take 1 tablet (10 mg total) by mouth at bedtime. 90 tablet 4   cyanocobalamin  (VITAMIN B12) 1000 MCG tablet Take 2 tablets (2,000 mcg total) by mouth daily. 90 tablet 4   levothyroxine   (SYNTHROID ) 88 MCG tablet Take 1 tablet (88 mcg total) by mouth daily. 90 tablet 4   losartan  (COZAAR ) 25 MG tablet Take 1 tablet (25 mg total) by mouth at bedtime. 90 tablet 4   sertraline  (ZOLOFT ) 50 MG tablet Take 1 tablet (50 mg total) by mouth daily. 90 tablet 3   progesterone  (PROMETRIUM ) 200 MG capsule Take 1 capsule (200 mg total) by mouth daily. (Patient not taking: Reported on 06/24/2024) 90 capsule 3   TURMERIC PO Take 2,400 mg by mouth in the morning and at bedtime. (Patient not taking: Reported on 06/24/2024)     No current facility-administered medications on file prior to visit.   "

## 2024-06-24 NOTE — Assessment & Plan Note (Signed)
 Chronic anxiety and depression Managed with sertraline  50 mg daily. Long-term use for anxiety and depression, particularly during stressful life events. - Continue sertraline  50 mg daily.

## 2024-06-24 NOTE — Assessment & Plan Note (Signed)
" °  Hyperlipidemia Managed with atorvastatin  at a low dose of 10 mg.. Recent cholesterol panel excellent. - Continue atorvastatin  as prescribed. "

## 2024-06-24 NOTE — Assessment & Plan Note (Signed)
" °  Blurred vision, right eye Blurred vision in right eye, particularly when driving. Previous cataract surgery in 2024. Referral to ophthalmologist Dr. Milan for further evaluation. - Referred to Dr. Milan for ophthalmology evaluation. "

## 2024-06-24 NOTE — Assessment & Plan Note (Signed)
" °  Acquired hypothyroidism Managed with levothyroxine  88 mcg. Thyroid  levels normal on recent blood work. - Continue levothyroxine  88 mcg daily. "

## 2024-06-24 NOTE — Assessment & Plan Note (Signed)
" °  Essential hypertension Hypertension managed with losartan  25 mg. Previous higher dose caused sharp head pain, resolved with dose reduction. - Continue losartan  25 mg daily. "

## 2024-06-24 NOTE — Assessment & Plan Note (Signed)
" °  Obstructive sleep apnea Moderate obstructive sleep apnea managed with CPAP for four years. Compliant with cpap use "

## 2024-07-24 ENCOUNTER — Telehealth: Payer: Self-pay

## 2024-07-24 NOTE — Telephone Encounter (Unsigned)
 Copied from CRM #[id number]. Topic: General - Other >> Jul 24, 2024  3:51 PM Joesph B wrote: Reason for CRM: patient is returning robin cox's call. Please fu

## 2024-09-30 ENCOUNTER — Ambulatory Visit: Payer: Medicare (Managed Care)

## 2025-01-15 ENCOUNTER — Ambulatory Visit: Admitting: Adult Health
